# Patient Record
Sex: Female | Born: 1945 | Race: White | Hispanic: No | Marital: Married | Smoking: Never smoker
Health system: Southern US, Community
[De-identification: ages and names within clinical notes are randomized; demographics above are authoritative.]

## PROBLEM LIST (undated history)

## (undated) DIAGNOSIS — I4891 Unspecified atrial fibrillation: Secondary | ICD-10-CM

## (undated) DIAGNOSIS — I340 Nonrheumatic mitral (valve) insufficiency: Secondary | ICD-10-CM

## (undated) DIAGNOSIS — Z8701 Personal history of pneumonia (recurrent): Secondary | ICD-10-CM

## (undated) DIAGNOSIS — E039 Hypothyroidism, unspecified: Secondary | ICD-10-CM

## (undated) HISTORY — DX: Nonrheumatic mitral (valve) insufficiency: I34.0

## (undated) HISTORY — DX: Hypothyroidism, unspecified: E03.9

## (undated) HISTORY — DX: Personal history of pneumonia (recurrent): Z87.01

## (undated) HISTORY — DX: Unspecified atrial fibrillation: I48.91

## (undated) HISTORY — PX: OTHER SURGICAL HISTORY: SHX169

---

## 2010-05-17 ENCOUNTER — Encounter: Payer: Self-pay | Admitting: Cardiology

## 2010-05-19 ENCOUNTER — Ambulatory Visit: Payer: Self-pay | Admitting: Cardiology

## 2010-05-20 ENCOUNTER — Encounter: Payer: Self-pay | Admitting: Cardiology

## 2010-05-21 ENCOUNTER — Encounter: Payer: Self-pay | Admitting: Cardiology

## 2010-05-22 ENCOUNTER — Encounter: Payer: Self-pay | Admitting: Cardiology

## 2010-05-24 ENCOUNTER — Ambulatory Visit: Payer: Self-pay | Admitting: Cardiology

## 2010-05-24 LAB — CONVERTED CEMR LAB: POC INR: 1.2

## 2010-05-27 ENCOUNTER — Telehealth (INDEPENDENT_AMBULATORY_CARE_PROVIDER_SITE_OTHER): Payer: Self-pay | Admitting: *Deleted

## 2010-05-31 ENCOUNTER — Ambulatory Visit: Payer: Self-pay | Admitting: Cardiology

## 2010-05-31 LAB — CONVERTED CEMR LAB: POC INR: 2.2

## 2010-06-07 ENCOUNTER — Ambulatory Visit: Payer: Self-pay | Admitting: Cardiology

## 2010-06-07 LAB — CONVERTED CEMR LAB: POC INR: 2.8

## 2010-06-17 ENCOUNTER — Ambulatory Visit: Payer: Self-pay | Admitting: Cardiology

## 2010-06-17 DIAGNOSIS — I482 Chronic atrial fibrillation, unspecified: Secondary | ICD-10-CM | POA: Insufficient documentation

## 2010-06-17 DIAGNOSIS — I359 Nonrheumatic aortic valve disorder, unspecified: Secondary | ICD-10-CM | POA: Insufficient documentation

## 2010-06-17 DIAGNOSIS — I080 Rheumatic disorders of both mitral and aortic valves: Secondary | ICD-10-CM | POA: Insufficient documentation

## 2010-06-18 ENCOUNTER — Encounter: Payer: Self-pay | Admitting: Cardiology

## 2010-06-18 LAB — CONVERTED CEMR LAB: Prothrombin Time: 20.4 s

## 2010-06-29 ENCOUNTER — Encounter: Payer: Self-pay | Admitting: Cardiology

## 2010-07-06 ENCOUNTER — Encounter: Payer: Self-pay | Admitting: Cardiology

## 2010-07-09 ENCOUNTER — Ambulatory Visit: Payer: Self-pay | Admitting: Cardiology

## 2010-07-09 ENCOUNTER — Encounter: Payer: Self-pay | Admitting: Cardiology

## 2010-07-23 ENCOUNTER — Ambulatory Visit: Payer: Self-pay | Admitting: Cardiology

## 2010-07-23 LAB — CONVERTED CEMR LAB: POC INR: 1.9

## 2010-08-06 ENCOUNTER — Ambulatory Visit: Payer: Self-pay | Admitting: Cardiology

## 2010-08-13 ENCOUNTER — Ambulatory Visit: Payer: Self-pay | Admitting: Cardiology

## 2010-08-16 ENCOUNTER — Ambulatory Visit: Payer: Self-pay | Admitting: Cardiology

## 2010-08-16 ENCOUNTER — Telehealth (INDEPENDENT_AMBULATORY_CARE_PROVIDER_SITE_OTHER): Payer: Self-pay | Admitting: *Deleted

## 2010-08-16 ENCOUNTER — Encounter (INDEPENDENT_AMBULATORY_CARE_PROVIDER_SITE_OTHER): Payer: Self-pay | Admitting: *Deleted

## 2010-08-16 DIAGNOSIS — I4892 Unspecified atrial flutter: Secondary | ICD-10-CM | POA: Insufficient documentation

## 2010-08-19 ENCOUNTER — Ambulatory Visit: Payer: Self-pay | Admitting: Cardiology

## 2010-08-19 ENCOUNTER — Ambulatory Visit (HOSPITAL_COMMUNITY)
Admission: RE | Admit: 2010-08-19 | Discharge: 2010-08-19 | Payer: Self-pay | Source: Home / Self Care | Admitting: Cardiology

## 2010-08-22 ENCOUNTER — Ambulatory Visit: Payer: Self-pay | Admitting: Cardiology

## 2010-08-22 LAB — CONVERTED CEMR LAB
INR: 1.2
POC INR: 1.2

## 2010-08-23 ENCOUNTER — Encounter: Payer: Self-pay | Admitting: Cardiology

## 2010-08-26 ENCOUNTER — Encounter: Payer: Self-pay | Admitting: Cardiology

## 2010-08-27 ENCOUNTER — Ambulatory Visit: Payer: Self-pay | Admitting: Cardiology

## 2010-08-27 LAB — CONVERTED CEMR LAB: POC INR: 1.9

## 2010-09-02 ENCOUNTER — Ambulatory Visit: Payer: Self-pay | Admitting: Thoracic Surgery (Cardiothoracic Vascular Surgery)

## 2010-09-02 ENCOUNTER — Encounter: Payer: Self-pay | Admitting: Cardiology

## 2010-09-03 ENCOUNTER — Ambulatory Visit (HOSPITAL_COMMUNITY)
Admission: RE | Admit: 2010-09-03 | Discharge: 2010-09-03 | Payer: Self-pay | Admitting: Thoracic Surgery (Cardiothoracic Vascular Surgery)

## 2010-09-05 ENCOUNTER — Encounter: Payer: Self-pay | Admitting: Cardiology

## 2010-09-05 ENCOUNTER — Encounter: Payer: Self-pay | Admitting: Thoracic Surgery (Cardiothoracic Vascular Surgery)

## 2010-09-06 ENCOUNTER — Telehealth (INDEPENDENT_AMBULATORY_CARE_PROVIDER_SITE_OTHER): Payer: Self-pay | Admitting: *Deleted

## 2010-09-09 ENCOUNTER — Ambulatory Visit: Payer: Self-pay | Admitting: Thoracic Surgery (Cardiothoracic Vascular Surgery)

## 2010-09-10 ENCOUNTER — Ambulatory Visit: Payer: Self-pay | Admitting: Thoracic Surgery (Cardiothoracic Vascular Surgery)

## 2010-09-10 ENCOUNTER — Inpatient Hospital Stay (HOSPITAL_COMMUNITY)
Admission: RE | Admit: 2010-09-10 | Discharge: 2010-09-16 | Payer: Self-pay | Source: Home / Self Care | Admitting: Thoracic Surgery (Cardiothoracic Vascular Surgery)

## 2010-09-10 ENCOUNTER — Encounter: Payer: Self-pay | Admitting: Thoracic Surgery (Cardiothoracic Vascular Surgery)

## 2010-09-10 HISTORY — PX: OTHER SURGICAL HISTORY: SHX169

## 2010-09-10 HISTORY — PX: MITRAL VALVE REPAIR: SHX2039

## 2010-09-23 ENCOUNTER — Encounter
Admission: RE | Admit: 2010-09-23 | Discharge: 2010-09-23 | Payer: Self-pay | Admitting: Thoracic Surgery (Cardiothoracic Vascular Surgery)

## 2010-09-23 ENCOUNTER — Encounter: Payer: Self-pay | Admitting: Cardiology

## 2010-09-23 ENCOUNTER — Ambulatory Visit: Payer: Self-pay | Admitting: Thoracic Surgery (Cardiothoracic Vascular Surgery)

## 2010-09-24 ENCOUNTER — Encounter: Payer: Self-pay | Admitting: Cardiology

## 2010-09-24 ENCOUNTER — Telehealth: Payer: Self-pay | Admitting: Cardiology

## 2010-09-24 LAB — CONVERTED CEMR LAB
INR: 1.3
Prothrombin Time: 16.4 s

## 2010-10-01 ENCOUNTER — Ambulatory Visit: Payer: Self-pay | Admitting: Cardiology

## 2010-10-01 LAB — CONVERTED CEMR LAB: POC INR: 3.4

## 2010-10-07 ENCOUNTER — Ambulatory Visit: Payer: Self-pay | Admitting: Cardiology

## 2010-10-07 ENCOUNTER — Encounter: Payer: Self-pay | Admitting: Physician Assistant

## 2010-10-08 ENCOUNTER — Encounter: Payer: Self-pay | Admitting: Cardiology

## 2010-10-15 ENCOUNTER — Ambulatory Visit: Payer: Self-pay | Admitting: Cardiology

## 2010-10-15 LAB — CONVERTED CEMR LAB: POC INR: 2.6

## 2010-10-21 ENCOUNTER — Encounter: Payer: Self-pay | Admitting: Cardiology

## 2010-10-22 ENCOUNTER — Ambulatory Visit: Payer: Self-pay | Admitting: Cardiology

## 2010-10-23 ENCOUNTER — Encounter: Payer: Self-pay | Admitting: Cardiology

## 2010-11-04 ENCOUNTER — Encounter
Admission: RE | Admit: 2010-11-04 | Discharge: 2010-11-04 | Payer: Self-pay | Admitting: Thoracic Surgery (Cardiothoracic Vascular Surgery)

## 2010-11-04 ENCOUNTER — Ambulatory Visit: Payer: Self-pay | Admitting: Thoracic Surgery (Cardiothoracic Vascular Surgery)

## 2010-11-04 ENCOUNTER — Encounter: Payer: Self-pay | Admitting: Cardiology

## 2010-11-08 ENCOUNTER — Ambulatory Visit: Payer: Self-pay | Admitting: Cardiology

## 2010-11-11 ENCOUNTER — Encounter: Payer: Self-pay | Admitting: Cardiology

## 2010-11-15 ENCOUNTER — Telehealth (INDEPENDENT_AMBULATORY_CARE_PROVIDER_SITE_OTHER): Payer: Self-pay | Admitting: *Deleted

## 2010-11-26 ENCOUNTER — Ambulatory Visit: Payer: Self-pay | Admitting: Cardiology

## 2010-12-12 ENCOUNTER — Encounter: Payer: Self-pay | Admitting: Cardiology

## 2010-12-16 ENCOUNTER — Ambulatory Visit
Admission: RE | Admit: 2010-12-16 | Discharge: 2010-12-16 | Payer: Self-pay | Source: Home / Self Care | Attending: Thoracic Surgery (Cardiothoracic Vascular Surgery) | Admitting: Thoracic Surgery (Cardiothoracic Vascular Surgery)

## 2010-12-16 ENCOUNTER — Encounter: Payer: Self-pay | Admitting: Cardiology

## 2010-12-17 ENCOUNTER — Ambulatory Visit: Admission: RE | Admit: 2010-12-17 | Discharge: 2010-12-17 | Payer: Self-pay | Source: Home / Self Care

## 2010-12-24 ENCOUNTER — Telehealth (INDEPENDENT_AMBULATORY_CARE_PROVIDER_SITE_OTHER): Payer: Self-pay | Admitting: *Deleted

## 2010-12-31 NOTE — Medication Information (Signed)
Summary: Coumadin Clinic  Anticoagulant Therapy  Managed by: Vashti Hey, RN PCP: [name] at [facility] Supervising MD: Antoine Poche MD, Fayrene Fearing Indication 1: Atrial Fibrillation Lab Used: [facility] [place] Site: [place] INR POC 1.7 INR RANGE 2.0-3.0  Dietary changes: no    Health status changes: no    Bleeding/hemorrhagic complications: no    Recent/future hospitalizations: no    Any changes in medication regimen? no    Recent/future dental: no  Any missed doses?: no       Is patient compliant with meds? yes       Allergies: 1)  ! Codeine  Anticoagulation Management History:      The patient is taking warfarin and comes in today for a routine follow up visit.  Positive risk factors for bleeding include an age of 67 years or older.  The bleeding index is 'intermediate risk'.  Negative CHADS2 values include Age > 66 years old.  Her last INR was 1.7.  Anticoagulation responsible Lexii Walsh: Antoine Poche MD, Fayrene Fearing.  INR POC: 1.7.  Cuvette Lot#: [id number].  Exp: 07/2011.    Anticoagulation Management Assessment/Plan:      The patient's current anticoagulation dose is Warfarin sodium 5 mg tabs: Use as directed by Anticoagulation Clinic.  The target INR is 2.0-3.0.  The next INR is due 07/23/2010.  Anticoagulation instructions were given to patient.  Results were reviewed/authorized by Vashti Hey, RN.  She was notified by Vashti Hey RN.         Prior Anticoagulation Instructions: Received fax from [facility] with results of PT/INR obtained on pt 06/17/10.  PT 20.4  INR 1.7   Called pt.  Told to increase coumadin tp 7.5mg  once daily except 10mg  on Tuesdays and Fridays  Current Anticoagulation Instructions: INR 1.7 Increase coumadin to 10mg  once daily except 7.5mg  on Mondays, Wednesdays and Fridays

## 2010-12-31 NOTE — Medication Information (Signed)
Summary: Coumadin Clinic  Anticoagulant Therapy  Managed by: Hoover Brunette, LPN PCP: [name] at [facility] Supervising MD: [name] MD, [name] Indication 1: Atrial Fibrillation Lab Used: [facility] [place] Site: [place] INR POC 4.3 INR RANGE 2.0-3.0  Dietary changes: no    Health status changes: no    Bleeding/hemorrhagic complications: no    Recent/future hospitalizations: no    Any changes in medication regimen? no    Recent/future dental: no  Any missed doses?: no       Is patient compliant with meds? yes       Allergies: 1)  ! Codeine  Anticoagulation Management History:      The patient is taking warfarin and comes in today for a routine follow up visit.  Warfarin therapy is being given due to Atrial Fibrillation .  Positive risk factors for bleeding include an age of 43 years or older.  Negative risk factors for bleeding include no history of CVA/TIA, no history of GI bleeding, and absence of serious comorbidities.  The bleeding index is 'intermediate risk'.  Positive CHADS2 values include History of HTN.  Negative CHADS2 values include History of CHF, Age > 37 years old, History of Diabetes, and Prior Stroke/CVA/TIA.  The start date was 05/22/2010.  Her last INR was 1.30.  Anticoagulation responsible provider: [name] MD, [name].  INR POC: 4.3.  Cuvette Lot#: [id number].  Exp: 07/2011.    Anticoagulation Management Assessment/Plan:      The patient's current anticoagulation dose is Warfarin sodium 5 mg tabs: Use as directed by Anticoagulation Clinic.  The target INR is 2.0-3.0.  The next INR is due 10/15/2010.  Anticoagulation instructions were given to patient.  Results were reviewed/authorized by Hoover Brunette, LPN.  She was notified by Hoover Brunette, LPN.         Prior Anticoagulation Instructions: INR 3.4 Decrease coumadin to 10mg  once daily except 7.5mg  on T,Th,Sat  Current Anticoagulation Instructions: INR 4.3 Decrease coumadin to 7.5mg  once daily

## 2010-12-31 NOTE — Consult Note (Signed)
Summary: CARDIOLOGY CONSULT/ [facility] DR. GOLDEN  CARDIOLOGY CONSULT/ [facility] DR. GOLDEN   Imported By: Zachary George 05/23/2010 09:38:23  _____________________________________________________________________  External Attachment:    Type:   Image     Comment:   External Document

## 2010-12-31 NOTE — Miscellaneous (Signed)
Summary: Orders Update - TTS  Clinical Lists Changes  Orders: Added new Referral order of TCTS Referral (TCTS Ref) - Signed

## 2010-12-31 NOTE — Medication Information (Signed)
Summary: Coumadin Clinic  Anticoagulant Therapy  Managed by: Vashti Hey, RN PCP: [name] at [facility] Supervising MD: [name] MD, [name] Indication 1: Atrial Fibrillation Lab Used: [facility] [place] Site: [place] PT 20.4 INR RANGE 2.0-3.0  Dietary changes: no    Health status changes: no    Bleeding/hemorrhagic complications: no    Recent/future hospitalizations: no    Any changes in medication regimen? no    Recent/future dental: no  Any missed doses?: no       Is patient compliant with meds? yes       Allergies: 1)  ! Codeine  Anticoagulation Management History:      The patient is taking warfarin and comes in today for a routine follow up visit.  Positive risk factors for bleeding include an age of 67 years or older.  The bleeding index is 'intermediate risk'.  Negative CHADS2 values include Age > 42 years old.  Today's INR is 1.7.  Prothrombin time is 20.4.  Anticoagulation responsible provider: [name] MD, [name].  Cuvette Lot#: [id number].  Exp: 07/2011.    Anticoagulation Management Assessment/Plan:      The patient's current anticoagulation dose is Warfarin sodium 5 mg tabs: Use as directed by Anticoagulation Clinic.  The target INR is 2.0-3.0.  The next INR is due 07/09/2010.  Anticoagulation instructions were given to patient.  Results were reviewed/authorized by Vashti Hey, RN.  She was notified by Vashti Hey RN.         Prior Anticoagulation Instructions: INR 2.8 Decrease coumadin to 7.5mg  once daily  Recheck INR 7/18 at Dr [name] appt  Current Anticoagulation Instructions: Received fax from [facility] with results of PT/INR obtained on pt 06/17/10.  PT 20.4  INR 1.7   Called pt.  Told to increase coumadin tp 7.5mg  once daily except 10mg  on Tuesdays and Fridays

## 2010-12-31 NOTE — Medication Information (Signed)
Summary: new CCR/ref:[facility]  Anticoagulant Therapy  Managed by: Weston Brass, PharmD Supervising MD: [name] MD, [name] Indication 1: Atrial Fibrillation Lab Used: [facility] [place] Site: [place] INR POC 1.2 INR RANGE 2.0-3.0  Dietary changes: no    Health status changes: no    Bleeding/hemorrhagic complications: no    Recent/future hospitalizations: yes       Details: discharged 6/22 from [facility].  Diagnosed with Afib.  Discharged on 7.5mg  daily of Coumadin.   Any changes in medication regimen? yes       Details: started on metoprolol 50mg  BID and cardizem 120mg  daily during hospitalizatin  Recent/future dental: no  Any missed doses?: no       Is patient compliant with meds? yes      Comments: Pt educated on bleeding risks, dietary concerns, and medication interactions.  Pt expressed understanding.   Current Medications (verified): 1)  Metoprolol Tartrate 50 Mg Tabs (Metoprolol Tartrate) .... Take One Tablet By Mouth Twice A Day 2)  Diltiazem Hcl Er Beads 120 Mg Xr24h-Cap (Diltiazem Hcl Er Beads) .... Take One Capsule By Mouth Daily 3)  Warfarin Sodium 5 Mg Tabs (Warfarin Sodium) .... Use As Directed By Anticoagulation Clinic 4)  Levothyroxine Sodium 125 Mcg Tabs (Levothyroxine Sodium) .... Take 1 Tablet By Mouth Daily 5)  Calcium Carbonate-Vitamin D 600-400 Mg-Unit  Tabs (Calcium Carbonate-Vitamin D) .... Take 1 Tablet Two Times A Day  Anticoagulation Management History:      The patient comes in today for her initial visit for anticoagulation therapy.  Positive risk factors for bleeding include an age of 67 years or older.  The bleeding index is 'intermediate risk'.  Negative CHADS2 values include Age > 67 years old.  Anticoagulation responsible provider: [name] MD, [name].  INR POC: 1.2.  Cuvette Lot#: [id number].  Exp: 07/2011.    Anticoagulation Management Assessment/Plan:      The patient's current anticoagulation dose is Warfarin sodium 5 mg tabs: Use as  directed by Anticoagulation Clinic.  The next INR is due 05/31/2010.  Anticoagulation instructions were given to patient.  Results were reviewed/authorized by Weston Brass, PharmD.  She was notified by Weston Brass PharmD.         Current Anticoagulation Instructions: INR 1.2  Increase dose to 1 1/2 tablets every day except 2 tablets on Tuesday and Friday.  Recheck INR in 1 week.

## 2010-12-31 NOTE — Miscellaneous (Signed)
Summary: med list update  Clinical Lists Changes

## 2010-12-31 NOTE — Medication Information (Signed)
Summary: ccrv, last check 11/7 --agh  Anticoagulant Therapy  Managed by: Vashti Hey, RN PCP: [name] at [facility] Supervising MD: Diona Browner MD, Remi Deter Indication 1: Atrial Fibrillation Lab Used: [facility] [place] Site: [place] INR POC 2.6 INR RANGE 2.0-3.0  Dietary changes: no    Health status changes: no    Bleeding/hemorrhagic complications: no    Recent/future hospitalizations: no    Any changes in medication regimen? no    Recent/future dental: no  Any missed doses?: no       Is patient compliant with meds? yes       Allergies: 1)  ! Codeine  Anticoagulation Management History:      Warfarin therapy is being given due to Atrial Fibrillation .  Positive risk factors for bleeding include an age of 25 years or older.  Negative risk factors for bleeding include no history of CVA/TIA, no history of GI bleeding, and absence of serious comorbidities.  The bleeding index is 'intermediate risk'.  Positive CHADS2 values include History of HTN.  Negative CHADS2 values include History of CHF, Age > 20 years old, History of Diabetes, and Prior Stroke/CVA/TIA.  The start date was 05/22/2010.  Her last INR was 1.30.  Anticoagulation responsible provider: Diona Browner MD, Remi Deter.  INR POC: 2.6.  Exp: 07/2011.    Anticoagulation Management Assessment/Plan:      The patient's current anticoagulation dose is Warfarin sodium 5 mg tabs: Use as directed by Anticoagulation Clinic.  The target INR is 2.0-3.0.  The next INR is due 10/22/2010.  Anticoagulation instructions were given to patient.  Results were reviewed/authorized by Vashti Hey, RN.  She was notified by Vashti Hey RN.         Prior Anticoagulation Instructions: INR 4.3 Decrease coumadin to 7.5mg  once daily   Current Anticoagulation Instructions: INR 2.6   Continue coumadin 7.5mg  once daily

## 2010-12-31 NOTE — Progress Notes (Signed)
Summary: scheduled for MV repair  Phone Note Call from Patient   Reason for Call: Talk to Nurse Summary of Call: Pt called stating she will be having Mitral Valve Repair by Dr Cornelius Moras on 09/10/10.  She went off her coumadin on 09/03/10.  She will try to get home health after surgery [facility]) if not she will come here for INR checks. Initial call taken by: Vashti Hey RN,  September 06, 2010 9:35 AM

## 2010-12-31 NOTE — Assessment & Plan Note (Signed)
Summary: hosp consult/ fu/ dc [facility] 6-22/needs INR-lr   Visit Type:  hospital follow-up Primary Provider:  [name] at [facility]   History of Present Illness: Tracy Acosta is a 65 year old female with a history of long-standing afebrile weight in our hospital records. Tracy Acosta was recently admitted with hypercarbic hypoxemia and shortness of breath. She was found to have pneumonia with patchy bilateral lower lobe air space infiltrates. She also had CT scan done which showed no pulmonary embolism. She had bilateral pleural effusions with basilar consultation and patchy parenchymal changes in Tracy upper lung zones. She was also found to have a fibrillation with rapid heart rate. In addition to metoprolol she was also started on diltiazem.in [place] records it has reported that this Acosta has chronic atrial fibrillation however early in [place] office Tracy Acosta is in normal sinus rhythm. Tracy Acosta is currently asymptomatic. She denies any chest pain shortness of breath orthopnea PND. She had a followup x-ray done which showed no residual infiltrates.  An echocardiogram performed demonstrated an ejection fraction of 65% but with moderate mitral regurgitation and mild aortic stenosis. Tracy Acosta did have moderate pulmonary hypertension with PA systolic pressure of 48 mmHg. She currently denies palpitations or syncope  Preventive Screening-Counseling & Management  Alcohol-Tobacco     Smoking Status: never  Current Medications (verified): 1)  Metoprolol Tartrate 50 Mg Tabs (Metoprolol Tartrate) .... Take One Tablet By Mouth Twice A Day 2)  Diltiazem Hcl Er Beads 120 Mg Xr24h-Cap (Diltiazem Hcl Er Beads) .... Take One Capsule By Mouth Daily 3)  Warfarin Sodium 5 Mg Tabs (Warfarin Sodium) .... Use As Directed By Anticoagulation Clinic 4)  Levothyroxine Sodium 125 Mcg Tabs (Levothyroxine Sodium) .... Take 1 Tablet By Mouth Daily  Allergies (verified): 1)  !  Codeine  Comments:  Nurse/Medical Assistant: Tracy Acosta's medication list and allergies were reviewed with Tracy Acosta and were updated in [place] Medication and Allergy Lists.  Past History:  Family History: Last updated: 06/17/2010 multiple family members with a defibrillation and her brother had a pulmonary embolism and an embolic stroke.  Social History: Last updated: 06/17/2010 Tracy Acosta is married and lives locally with her husband. She denies any alcohol tobacco or drug use.  Past Medical History: paroxysmal atrial fibrillation Hypothyroidism Pneumonia He showed bilateral pleural effusions Mitral regurgitation and aortic stenosis  Family History: multiple family members with a defibrillation and her brother had a pulmonary embolism and an embolic stroke.  Social History: Tracy Acosta is married and lives locally with her husband. She denies any alcohol tobacco or drug use.Smoking Status:  never  Review of Systems  Tracy Acosta denies fatigue, malaise, fever, weight gain/loss, vision loss, decreased hearing, hoarseness, chest pain, palpitations, shortness of breath, prolonged cough, wheezing, sleep apnea, coughing up blood, abdominal pain, blood in stool, nausea, vomiting, diarrhea, heartburn, incontinence, blood in urine, muscle weakness, joint pain, leg swelling, rash, skin lesions, headache, fainting, dizziness, depression, anxiety, enlarged lymph nodes, easy bruising or bleeding, and environmental allergies.    Vital Signs:  Acosta profile:   65 year old female Height:      68 inches Weight:      198 pounds BMI:     30.21 Pulse rate:   55 / minute BP sitting:   143 / 84  (left arm) Cuff size:   regular  Vitals Entered By: Carlye Grippe (June 17, 2010 9:11 AM)  Nutrition Counseling: Acosta's BMI is greater than 25 and therefore counseled on weight management options.  Physical Exam  Additional Exam:  General: Well-developed, well-nourished in no  distress head: Normocephalic and atraumatic eyes PERRLA/EOMI intact, conjunctiva and lids normal nose: No deformity or lesions mouth normal dentition, normal posterior pharynx neck: Supple, no JVD.  No masses, thyromegaly or abnormal cervical nodes lungs: Normal breath sounds bilaterally without wheezing.  Normal percussion heart: regular rate and rhythm with normal S1 and S2, no S3 or S4.  PMI is normal. 3/ 6 decrescendo murmur at Tracy right upper sternal border.2/6 holosystolic murmur at Tracy apex. abdomen: Normal bowel sounds, abdomen is soft and nontender without masses, organomegaly or hernias noted.  No hepatosplenomegaly musculoskeletal: Back normal, normal gait muscle strength and tone normal pulsus: Pulse is normal in all 4 extremities Extremities: No peripheral pitting edema neurologic: Alert and oriented x 3 skin: Intact without lesions or rashes cervical nodes: No significant adenopathy psychologic: Normal affect    EKG  Procedure date:  06/17/2010  Findings:      normal sinus rhythm. Heart rate 60 beats per minute no acute ischemic changes.  Impression & Recommendations:  Problem # 1:  ATRIAL FIBRILLATION (ICD-427.31) Tracy Acosta is had paroxysmal atrial fibrillation not permanent atrial fibrillation she currently normal sinus rhythm. We'll continue her medical therapy as well as anticoagulation. Her updated medication list for this problem includes:    Metoprolol Tartrate 50 Mg Tabs (Metoprolol tartrate) .Marland Kitchen... Take one tablet by mouth twice a day    Warfarin Sodium 5 Mg Tabs (Warfarin sodium) ..... Use as directed by anticoagulation clinic  Orders: EKG w/ Interpretation (93000) T-Protime, Auto ([id number])  Problem # 2:  MITRAL REGURGITATION (ICD-396.3) Tracy Acosta has mild mitral regurgitation and this will need to be followed with an echocardiogram in Tracy next months. She is currently NYHA class I.  Problem # 3:  AORTIC STENOSIS (ICD-424.1) Tracy Acosta has a  very loud aortic murmur approximately 3-4/6 yet she only appears to have mild aortic stenosis by echocardiogram. Followup in 6 months as outlined above. Her updated medication list for this problem includes:    Metoprolol Tartrate 50 Mg Tabs (Metoprolol tartrate) .Marland Kitchen... Take one tablet by mouth twice a day  Problem # 4:  COUMADIN THERAPY (ICD-V58.61) PT/INR will be checked today.  Acosta Instructions: 1)  Echo in 6 months 2)  Lab:  PT/INR  3)  Follow up in  6 months  Prescriptions: DILTIAZEM HCL ER BEADS 120 MG XR24H-CAP (DILTIAZEM HCL ER BEADS) Take one capsule by mouth daily  #90 x 3   Entered by:   Hoover Brunette, LPN   Authorized by:   Lewayne Bunting, MD, [facility]   Signed by:   Hoover Brunette, LPN on 09/81/1914   Method used:   Faxed to ...       [facility] education [facility])       [address] [address]       [place], [place]  [address]       Ph: [phone]       Fax: [phone]   RxID:   [id number] METOPROLOL TARTRATE 50 MG TABS (METOPROLOL TARTRATE) Take one tablet by mouth twice a day  #180 x 3   Entered by:   Hoover Brunette, LPN   Authorized by:   Lewayne Bunting, MD, [facility]   Signed by:   Hoover Brunette, LPN on 40/34/7425   Method used:   Faxed to ...       [facility] [employer] [facility])       [address] [address]       [place], [place]  [address]       Ph: [phone]       Fax: [phone]   RxID:   [id number]

## 2010-12-31 NOTE — Letter (Signed)
Summary: External Correspondence/ FAXED [facility]  External Correspondence/ FAXED [facility]   Imported By: Dorise Hiss 09/17/2010 15:36:45  _____________________________________________________________________  External Attachment:    Type:   Image     Comment:   External Document

## 2010-12-31 NOTE — Medication Information (Signed)
Summary: ccr-lr  Anticoagulant Therapy  Managed by: Vashti Hey, RN PCP: [name] at [facility] Supervising MD: Diona Browner MD, Remi Deter Indication 1: Atrial Fibrillation Lab Used: [facility] [place] Site: [place] INR POC 3.4 INR RANGE 2.0-3.0  Dietary changes: no    Health status changes: no    Bleeding/hemorrhagic complications: no    Recent/future hospitalizations: yes       Details: S/P Mitral Valve        Allergies: 1)  ! Codeine  Anticoagulation Management History:      The patient is taking warfarin and comes in today for a routine follow up visit.  She is being anticoagulated because of Atrial Fibrillation .  Positive risk factors for bleeding include an age of 50 years or older.  Negative risk factors for bleeding include no history of CVA/TIA, no history of GI bleeding, and absence of serious comorbidities.  The bleeding index is 'intermediate risk'.  Positive CHADS2 values include History of HTN.  Negative CHADS2 values include History of CHF, Age > 56 years old, History of Diabetes, and Prior Stroke/CVA/TIA.  The start date was 05/22/2010.  Her last INR was 1.30.  Anticoagulation responsible provider: Diona Browner MD, Remi Deter.  INR POC: 3.4.  Cuvette Lot#: [id number].  Exp: 07/2011.    Anticoagulation Management Assessment/Plan:      The patient's current anticoagulation dose is Warfarin sodium 5 mg tabs: Use as directed by Anticoagulation Clinic.  The target INR is 2.0-3.0.  The next INR is due 10/07/2010.  Anticoagulation instructions were given to patient.  Results were reviewed/authorized by Vashti Hey, RN.  She was notified by Vashti Hey RN.         Prior Anticoagulation Instructions: INR 1.30 Received fax from Dr Orvan July office with results of PT/INR obtained on pt Monday 09/23/10.  PT 16.4  INR 1.30  Spoke wit pt.  She was discharged on coumadin 5mg  once daily on 09/13/10.  Pt told to increase coumadin to 10mg  once daily (which was her previous  theraputic dose) and recheck INR in office on 10/01/10.  She verbalized understanding.  Current Anticoagulation Instructions: INR 3.4 Decrease coumadin to 10mg  once daily except 7.5mg  on T,Th,Sat

## 2010-12-31 NOTE — Consult Note (Signed)
Summary: CARDIOLOGY CONSULT/ [facility]  CARDIOLOGY CONSULT/ [facility]   Imported By: Zachary George 08/16/2010 11:15:03  _____________________________________________________________________  External Attachment:    Type:   Image     Comment:   External Document  Appended Document: CARDIOLOGY CONSULT/ [facility] patient was recently seen in the emergency room with recurrent atrial flutter/atrial fibrillation. However she was found to have a very pathological mitral regurgitant murmur and also had been complaining of symptoms of shortness of breath since her hospitalization on June of 2011. A diagnosis of community-acquired pneumonia was made, but her BNP level was markedly elevated at that time. I reviewed her chest x-ray also from that admission and it appeared to me that she had congestive heart failure. Her symptoms during this admission also were consistent with heart failure and the patient improved significantly with the addition of Lasix. She was kept overnight for diuresis. I found that she had a very pathological mitral regurgitant murmur and was concerned that she had severe mitral regurgitation. 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 Pulmonary veins were visualized and although there was no definite bony vein flow reversal the jet swirled around the whole left atrium. LV function was normal as a matter of fact was hyperdynamic. The patient however has symptomatic, severe mitral regurgitation and will need mitral valve repair in addition to a Cox-Maze procedure. Severity of mitral regurgitation can be further assessed during cardiac catheterization. If there is any question regarding the severity of mitral regurgitation based on the ventriculogram, certainly an MRI can be considered but is likely not need it as the patient is quite symptomatic and  I see no other obvious causes for her symptoms. The patient is still deciding where she wants to have the valve surgery done. One of the patient's daughters is very insistent that she goes" to the best surgeon". I have given the patient and her family several references, including Dr. Villa Herb at [facility] in [place]. If her insurance company will cover this she would prefer to go there.

## 2010-12-31 NOTE — Progress Notes (Signed)
Summary: CAN NOT DO BLOOD WORK UNTIL AFTER 12  Phone Note Call from Patient Call back at [facility] Phone [phone]   Caller: Patient Reason for Call: Talk to Nurse Details for Reason: BLOOD WORK Summary of Call: RETURNING YOUR CALL IN REFERENCE TO HAVING BLOOD WORK DONE. SAID SHE CAN NOT DO UNTIL AFTER 12.  PLEASE CALL HER BACK ASAP  SHE THOUGHT IT WAS CCR TO BE CHECKED BUT WASNT SURE????? I COULD NOT FIND A NOTE WHERE YOU CALLED. Initial call taken by: Claudette Laws,  August 16, 2010 8:27 AM  Follow-up for Phone Call        Spoke with pt.  She will come at 10:00 for coumadin check.    Follow-up by: Hoover Brunette, LPN,  August 16, 2010 9:48 AM

## 2010-12-31 NOTE — Miscellaneous (Signed)
Summary: Orders Update - cath  Clinical Lists Changes  Problems: Added new problem of ATRIAL FLUTTER (ICD-427.32) Orders: Added new Referral order of Cardiac Catheterization (Cardiac Cath) - Signed

## 2010-12-31 NOTE — Consult Note (Signed)
Summary: [facility] New Patient Consult  [facility] New Patient Consult   Imported By: Roderic Ovens 09/17/2010 11:35:00  _____________________________________________________________________  External Attachment:    Type:   Image     Comment:   External Document

## 2010-12-31 NOTE — Medication Information (Signed)
Summary: ccr-lr  Anticoagulant Therapy  Managed by: Vashti Hey, RN PCP: [name] at [facility] Supervising MD: Myrtis Ser MD, Tinnie Gens Indication 1: Atrial Fibrillation Lab Used: [facility] [place] Site: [place] INR POC 1.8 INR RANGE 2.0-3.0  Dietary changes: no    Health status changes: no    Bleeding/hemorrhagic complications: no    Recent/future hospitalizations: no    Any changes in medication regimen? no    Recent/future dental: no  Any missed doses?: no       Is patient compliant with meds? yes       Allergies: 1)  ! Codeine  Anticoagulation Management History:      The patient is taking warfarin and comes in today for a routine follow up visit.  Warfarin therapy is being given due to Atrial Fibrillation .  Positive risk factors for bleeding include an age of 67 years or older.  Negative risk factors for bleeding include no history of CVA/TIA, no history of GI bleeding, and absence of serious comorbidities.  The bleeding index is 'intermediate risk'.  Positive CHADS2 values include History of HTN.  Negative CHADS2 values include History of CHF, Age > 67 years old, History of Diabetes, and Prior Stroke/CVA/TIA.  The start date was 05/22/2010.  Her last INR was 1.30.  Anticoagulation responsible provider: Myrtis Ser MD, Tinnie Gens.  INR POC: 1.8.  Cuvette Lot#: [id number].  Exp: 07/2011.    Anticoagulation Management Assessment/Plan:      The patient's current anticoagulation dose is Warfarin sodium 5 mg tabs: Use as directed by Anticoagulation Clinic.  The target INR is 2.0-3.0.  The next INR is due 11/26/2010.  Anticoagulation instructions were given to patient.  Results were reviewed/authorized by Vashti Hey, RN.  She was notified by Vashti Hey RN.         Prior Anticoagulation Instructions: INR 2.3 Continue coumadin 7.5mg  once daily   Current Anticoagulation Instructions: INR 1.8 Increase coumadin to 7.5mg  once daily except 10mg  on Mondays and Fridays

## 2010-12-31 NOTE — Medication Information (Signed)
Summary: ccrv, last check 9/22   --agh  Anticoagulant Therapy  Managed by: Vashti Hey, RN PCP: [name] at [facility] Supervising MD: [name] MD, [name] Indication 1: Atrial Fibrillation Lab Used: [facility] [place] Site: [place] INR POC 1.9 INR RANGE 2.0-3.0  Dietary changes: no    Health status changes: no    Bleeding/hemorrhagic complications: no    Recent/future hospitalizations: yes       Details: pending MVR Dr Cornelius Moras  Any changes in medication regimen? no    Recent/future dental: no  Any missed doses?: no       Is patient compliant with meds? yes       Allergies: 1)  ! Codeine  Anticoagulation Management History:      The patient is taking warfarin and comes in today for a routine follow up visit.  She is being anticoagulated because of Atrial Fibrillation .  Positive risk factors for bleeding include an age of 66 years or older.  Negative risk factors for bleeding include no history of CVA/TIA, no history of GI bleeding, and absence of serious comorbidities.  The bleeding index is 'intermediate risk'.  Positive CHADS2 values include History of HTN.  Negative CHADS2 values include History of CHF, Age > 70 years old, History of Diabetes, and Prior Stroke/CVA/TIA.  The start date was 05/22/2010.  Her last INR was 1.2.  Anticoagulation responsible provider: [name] MD, [name].  INR POC: 1.9.  Cuvette Lot#: [id number].  Exp: 07/2011.    Anticoagulation Management Assessment/Plan:      The patient's current anticoagulation dose is Warfarin sodium 5 mg tabs: Use as directed by Anticoagulation Clinic.  The target INR is 2.0-3.0.  The next INR is due 09/06/2010.  Anticoagulation instructions were given to patient.  Results were reviewed/authorized by Vashti Hey, RN.  She was notified by Vashti Hey RN.         Prior Anticoagulation Instructions: INR 1.2 Coumadin has been on hold for cath on 08/19/10.  Restarted that night.  Continue coumadin 10mg  once daily and  recheck INR 08/27/10.  Current Anticoagulation Instructions: INR 1.9 Continue coumadin 10mg  once daily  Has appt with Dr Cornelius Moras 09/02/10 to schedule MVR

## 2010-12-31 NOTE — Assessment & Plan Note (Signed)
Summary: eph-dsch mmh er  Florian Buff requested 2-3 day   Visit Type:  Follow-up Primary Provider:  [name] at [facility]  CC:  palpitations.  History of Present Illness: The patient presents for followup after her an ER visit for shortness of breath. She actually had 2 episodes on consecutive Friday nights starting with a feeling like her head was getting stopped up. She would then get very "panicky". She was restless all my long period she did not describe tachycardia palpitations and in fact was never noted to be in atrial fibrillation either by her sister who is a Engineer, civil (consulting) and with her, on one of the occasions when she presented to urgent care or last week when she came to our emergency room. The etiology of these events was not clear though she was told to followup here. She did not describe chest discomfort, neck or arm discomfort. Prior to this she wasn't having any breathlessness, PND or orthopnea. She said that since the last episode she's been taking over-the-counter "mucous relief" and has been able to avoid any subsequent episodes.  Current Medications (verified): 1)  Metoprolol Tartrate 50 Mg Tabs (Metoprolol Tartrate) .... Take One Tablet By Mouth Twice A Day 2)  Diltiazem Hcl Er Beads 120 Mg Xr24h-Cap (Diltiazem Hcl Er Beads) .... Take One Capsule By Mouth Daily 3)  Warfarin Sodium 5 Mg Tabs (Warfarin Sodium) .... Use As Directed By Anticoagulation Clinic 4)  Levothyroxine Sodium 125 Mcg Tabs (Levothyroxine Sodium) .... Take 1 Tablet By Mouth Daily 5)  Nasonex 50 Mcg/act Susp (Mometasone Furoate) .Marland Kitchen.. 1-2 Sprays/nostril Daily 6)  Zyrtec Hives Relief 10 Mg Tabs (Cetirizine Hcl) .... Take 1 Tablet By Mouth Once A Day 7)  Mucus Relief 400 Mg Tabs (Guaifenesin) .... Take 1 Tablet By Mouth Two Times A Day 8)  Furosemide 20 Mg Tabs (Furosemide) .... Take 1 Tablet By Mouth Two Times A Day  Allergies (verified): 1)  ! Codeine  Comments:  Nurse/Medical Assistant: The patient's  medication list/bottles and allergies were reviewed with the patient and were updated in the Medication and Allergy Lists.  Past History:  Past Medical History: Paroxysmal atrial fibrillation Hypothyroidism Pneumonia Mitral regurgitation and aortic stenosis  Past Surgical History: Arthroscopic Left Knee Surgery  Review of Systems       As stated in the HPI and negative for all other systems.   Vital Signs:  Patient profile:   65 year old female Height:      68 inches Weight:      192 pounds Pulse rate:   60 / minute BP sitting:   132 / 84  (left arm) Cuff size:   regular  Vitals Entered By: Carlye Grippe (July 09, 2010 3:32 PM)  Physical Exam  General:  Well developed, well nourished, in no acute distress. Head:  normocephalic and atraumatic Neck:  Neck supple, no JVD. No masses, thyromegaly or abnormal cervical nodes. Chest Wall:  no deformities or breast masses noted Lungs:  Clear bilaterally to auscultation and percussion. Heart:  S1 and S2 within normal limits, no S3, no S4, no clicks, no rubs, 3/6 apical systolic murmur radiating to the axilla and anteriorly, holosystolic, no diastolic murmurs Abdomen:  Bowel sounds positive; abdomen soft and non-tender without masses, organomegaly, or hernias noted. No hepatosplenomegaly. Msk:  Back normal, normal gait. Muscle strength and tone normal. Pulses:  pulses normal in all 4 extremities Extremities:  No clubbing or cyanosis. Neurologic:  Alert and oriented x 3. Skin:  Intact without  lesions or rashes. Psych:  slightly anxious appearing   Other Orders: EKG w/ Interpretation (93000)  Patient Instructions: 1)  Keep follow up as planned for Echo and appt with Dr. Earnestine Leys in Dec 2011 or Jan 2012. 2)  Xanax 0.25mg  by mouth two times a day as needed.  Prescriptions: XANAX 0.25 MG TABS (ALPRAZOLAM) Take 1 tablet by mouth two times a day as needed  #15 x 0   Entered by:   Cyril Loosen, RN, BSN   Authorized by:   Rollene Rotunda, MD, [facility]   Signed by:   Rollene Rotunda, MD, [facility] on 07/09/2010   Method used:   Handwritten   RxID:   [id number] PROPRANOLOL HCL 10 MG TABS (PROPRANOLOL HCL) Take 1 tablet by mouth three times a day  #90 x 6   Entered by:   Cyril Loosen, RN, BSN   Authorized by:   Rollene Rotunda, MD, [facility]   Signed by:   Cyril Loosen, RN, BSN on 07/09/2010   Method used:   Electronically to        [facility]* (retail)       [address]       [place], [place]  [address]       Ph: [phone]       Fax: [phone]   RxID:   [id number]  Propranolol sent by mistake pharmacy will be notified to cancel this prescription. Cyril Loosen, RN, BSN  July 09, 2010 4:23 PM   Appended Document: eph-dsch mmh er  Florian Buff requested 2-3 day A/P 1)  SOB  - I do not suspect tachycardia arrhythmias. These may well have been panic attacks. Toward that end I have taken the liberty of giving her a prescription for 0.25 mg Xanax to be taken as needed. I gave her 15 pills with no refills and asked her to discuss any further refills or need for this medication with her primary physician.         2)  Atrial fibrillation - She remains on Coumadin. No further therapy is indicated.         3)  Mitral  regurgitation - She has scheduled followup echocardiography and a visit with Dr. Earnestine Leys.  She will keep these appts.

## 2010-12-31 NOTE — Medication Information (Signed)
Summary: ccr-lr  Anticoagulant Therapy  Managed by: Vashti Hey, RN PCP: [name] at [facility] Supervising MD: Diona Browner MD, Remi Deter Indication 1: Atrial Fibrillation Lab Used: [facility] [place] Site: [place] INR POC 2.3 INR RANGE 2.0-3.0  Dietary changes: no    Health status changes: no    Bleeding/hemorrhagic complications: no    Recent/future hospitalizations: no    Any changes in medication regimen? no    Recent/future dental: no  Any missed doses?: no       Is patient compliant with meds? yes       Allergies: 1)  ! Codeine  Anticoagulation Management History:      The patient is taking warfarin and comes in today for a routine follow up visit.  Warfarin therapy is being given due to Atrial Fibrillation .  Positive risk factors for bleeding include an age of 67 years or older.  Negative risk factors for bleeding include no history of CVA/TIA, no history of GI bleeding, and absence of serious comorbidities.  The bleeding index is 'intermediate risk'.  Positive CHADS2 values include History of HTN.  Negative CHADS2 values include History of CHF, Age > 57 years old, History of Diabetes, and Prior Stroke/CVA/TIA.  The start date was 05/22/2010.  Her last INR was 1.30.  Anticoagulation responsible provider: Diona Browner MD, Remi Deter.  INR POC: 2.3.  Cuvette Lot#: [id number].  Exp: 07/2011.    Anticoagulation Management Assessment/Plan:      The patient's current anticoagulation dose is Warfarin sodium 5 mg tabs: Use as directed by Anticoagulation Clinic.  The target INR is 2.0-3.0.  The next INR is due 11/08/2010.  Anticoagulation instructions were given to patient.  Results were reviewed/authorized by Vashti Hey, RN.  She was notified by Vashti Hey RN.         Prior Anticoagulation Instructions: INR 2.6   Continue coumadin 7.5mg  once daily   Current Anticoagulation Instructions: INR 2.3 Continue coumadin 7.5mg  once daily

## 2010-12-31 NOTE — Medication Information (Signed)
Summary: ccr-lr  Anticoagulant Therapy  Managed by: Vashti Hey, RN Supervising MD: Diona Browner MD, Remi Deter Indication 1: Atrial Fibrillation Lab Used: [facility] [place] Site: [place] INR POC 2.8 INR RANGE 2.0-3.0  Dietary changes: no    Health status changes: no    Bleeding/hemorrhagic complications: no    Recent/future hospitalizations: no    Any changes in medication regimen? no    Recent/future dental: no  Any missed doses?: no       Is patient compliant with meds? yes       Anticoagulation Management History:      The patient is taking warfarin and comes in today for a routine follow up visit.  Positive risk factors for bleeding include an age of 67 years or older.  The bleeding index is 'intermediate risk'.  Negative CHADS2 values include Age > 67 years old.  Anticoagulation responsible provider: Diona Browner MD, Remi Deter.  INR POC: 2.8.  Cuvette Lot#: [id number].  Exp: 07/2011.    Anticoagulation Management Assessment/Plan:      The patient's current anticoagulation dose is Warfarin sodium 5 mg tabs: Use as directed by Anticoagulation Clinic.  The target INR is 2.0-3.0.  The next INR is due 06/17/2010.  Anticoagulation instructions were given to patient.  Results were reviewed/authorized by Vashti Hey, RN.  She was notified by Vashti Hey RN.         Prior Anticoagulation Instructions: INR 2.2 Continue coumadin 7.5mg  once daily except 10mg  on Tuesdays and Fridays  Current Anticoagulation Instructions: INR 2.8 Decrease coumadin to 7.5mg  once daily  Recheck INR 7/18 at Dr [name] appt

## 2010-12-31 NOTE — Medication Information (Signed)
Summary: Coumadin Clinic  Anticoagulant Therapy  Managed by: Hoover Brunette LPN PCP: [name] at [facility] Supervising MD: [name] MD, [name] Indication 1: Atrial Fibrillation Lab Used: [facility] [place] Site: [place] INR POC 1.2 INR RANGE 2.0-3.0  Dietary changes: no    Health status changes: no    Bleeding/hemorrhagic complications: no    Recent/future hospitalizations: yes       Details: s/p cardiac cath 08/19/10  Any changes in medication regimen? no    Recent/future dental: no  Any missed doses?: yes     Details: coumadin has been on hold for cath  Is patient compliant with meds? yes       Allergies: 1)  ! Codeine  Anticoagulation Management History:      The patient is taking warfarin and comes in today for a routine follow up visit.  She is being anticoagulated because of Atrial Fibrillation .  Positive risk factors for bleeding include an age of 67 years or older.  Negative risk factors for bleeding include no history of CVA/TIA, no history of GI bleeding, and absence of serious comorbidities.  The bleeding index is 'intermediate risk'.  Positive CHADS2 values include History of HTN.  Negative CHADS2 values include History of CHF, Age > 14 years old, History of Diabetes, and Prior Stroke/CVA/TIA.  The start date was 05/22/2010.  Her last INR was 1.7.  Anticoagulation responsible provider: [name] MD, [name].  INR POC: 1.2.  Cuvette Lot#: [id number].  Exp: 07/2011.    Anticoagulation Management Assessment/Plan:      The patient's current anticoagulation dose is Warfarin sodium 5 mg tabs: Use as directed by Anticoagulation Clinic.  The target INR is 2.0-3.0.  The next INR is due 08/27/2010.  Anticoagulation instructions were given to patient.  Results were reviewed/authorized by [facility] LPN.  She was notified by [facility] LPN.         Prior Anticoagulation Instructions: INR 1.3 Coumadin is on hold for a cath on MOnday  Current Anticoagulation  Instructions: INR 1.2 Coumadin has been on hold for cath on 08/19/10.  Restarted that night.  Continue coumadin 10mg  once daily and recheck INR 08/27/10.

## 2010-12-31 NOTE — Medication Information (Signed)
Summary: ccr-lr  Anticoagulant Therapy  Managed by: Vashti Hey, RN PCP: [name] at [facility] Supervising MD: Antoine Poche MD, Fayrene Fearing Indication 1: Atrial Fibrillation Lab Used: [facility] [place] Site: [place] INR POC 2.0 INR RANGE 2.0-3.0  Dietary changes: no    Health status changes: no    Bleeding/hemorrhagic complications: no    Recent/future hospitalizations: no    Any changes in medication regimen? no    Recent/future dental: no  Any missed doses?: no       Is patient compliant with meds? yes       Allergies: 1)  ! Codeine  Anticoagulation Management History:      The patient is taking warfarin and comes in today for a routine follow up visit.  She is being anticoagulated due to Atrial Fibrillation .  Positive risk factors for bleeding include an age of 65 years or older.  Negative risk factors for bleeding include no history of CVA/TIA, no history of GI bleeding, and absence of serious comorbidities.  The bleeding index is 'intermediate risk'.  Positive CHADS2 values include History of HTN.  Negative CHADS2 values include History of CHF, Age > 34 years old, History of Diabetes, and Prior Stroke/CVA/TIA.  The start date was 05/22/2010.  Her last INR was 1.7.  Anticoagulation responsible provider: Antoine Poche MD, Fayrene Fearing.  INR POC: 2.0.  Cuvette Lot#: [id number].  Exp: 07/2011.    Anticoagulation Management Assessment/Plan:      The patient's current anticoagulation dose is Warfarin sodium 5 mg tabs: Use as directed by Anticoagulation Clinic.  The target INR is 2.0-3.0.  The next INR is due 08/20/2010.  Anticoagulation instructions were given to patient.  Results were reviewed/authorized by Vashti Hey, RN.  She was notified by Vashti Hey RN.         Prior Anticoagulation Instructions: INR 1.9 Increase coumadin to 10mg  once daily   Current Anticoagulation Instructions: INR 2.0 Take coumadin 12.5mg  x 2 then resume 10mg  once daily

## 2010-12-31 NOTE — Medication Information (Signed)
Summary: ccrv  --agh  Anticoagulant Therapy  Managed by: Vashti Hey, RN PCP: [name] at [facility] Supervising MD: Antoine Poche MD, Fayrene Fearing Indication 1: Atrial Fibrillation Lab Used: [facility] [place] Site: [place] INR POC 1.3 INR RANGE 2.0-3.0  Dietary changes: no    Health status changes: no    Bleeding/hemorrhagic complications: no    Recent/future hospitalizations: yes       Details: Going for heart cath on 08/19/10  Any changes in medication regimen? yes       Details: Coumadin has been on hold since 08/13/10  Recent/future dental: no  Any missed doses?: no       Is patient compliant with meds? yes       Allergies: 1)  ! Codeine  Anticoagulation Management History:      The patient is taking warfarin and comes in today for a routine follow up visit.  She is being anticoagulated because of Atrial Fibrillation .  Positive risk factors for bleeding include an age of 40 years or older.  Negative risk factors for bleeding include no history of CVA/TIA, no history of GI bleeding, and absence of serious comorbidities.  The bleeding index is 'intermediate risk'.  Positive CHADS2 values include History of HTN.  Negative CHADS2 values include History of CHF, Age > 32 years old, History of Diabetes, and Prior Stroke/CVA/TIA.  The start date was 05/22/2010.  Her last INR was 1.7.  Anticoagulation responsible Paublo Warshawsky: Antoine Poche MD, Fayrene Fearing.  INR POC: 1.3.  Cuvette Lot#: [id number].  Exp: 07/2011.    Anticoagulation Management Assessment/Plan:      The patient's current anticoagulation dose is Warfarin sodium 5 mg tabs: Use as directed by Anticoagulation Clinic.  The target INR is 2.0-3.0.  The next INR is due 08/22/2010.  Anticoagulation instructions were given to patient.  Results were reviewed/authorized by Vashti Hey, RN.  She was notified by Vashti Hey RN.         Prior Anticoagulation Instructions: INR 2.0 Take coumadin 12.5mg  x 2 then resume 10mg  once daily    Current Anticoagulation Instructions: INR 1.3 Coumadin is on hold for a cath on MOnday

## 2010-12-31 NOTE — Medication Information (Addendum)
Summary: ccr-lr  Anticoagulant Therapy  Managed by: Vashti Hey, RN PCP: [name] at [facility] Supervising MD: [name] MD, [name] Indication 1: Atrial Fibrillation Lab Used: [facility] [place] Site: [place] INR RANGE 2.0-3.0           Allergies: 1)  ! Codeine  Anticoagulation Management History:      Positive risk factors for bleeding include an age of 67 years or older.  The bleeding index is 'intermediate risk'.  Negative CHADS2 values include Age > 67 years old.  Her last INR was 1.7.  Anticoagulation responsible provider: [name] MD, [name].  Exp: 07/2011.    Anticoagulation Management Assessment/Plan:      The patient's current anticoagulation dose is Warfarin sodium 5 mg tabs: Use as directed by Anticoagulation Clinic.  The target INR is 2.0-3.0.  The next INR is due 07/09/2010.  Anticoagulation instructions were given to patient.  Results were reviewed/authorized by Vashti Hey, RN.         Prior Anticoagulation Instructions: Received fax from [facility] with results of PT/INR obtained on pt 06/17/10.  PT 20.4  INR 1.7   Called pt.  Told to increase coumadin tp 7.5mg  once daily except 10mg  on Tuesdays and Fridays

## 2010-12-31 NOTE — Progress Notes (Signed)
Summary: SOB  Phone Note Call from Patient Call back at [facility] Phone [phone]   Summary of Call: Pt called the office c/o SOB and trouble sleeping. She states she does okay during the day but gets in a panic at night. She states she thinks this is related to the pneumonia she had in the hospital. Pt scheduled for CCR visit on 7/1 and appt with Dr. Earnestine Leys on 7/18. Pt notified to see primary MD for SOB that she feels is related to pneumonia. Pt state she doesn't have a primary care physician. She is aware to go to Urgent Care for evaluation of SOB with no primary MD. Initial call taken by: Cyril Loosen, RN, BSN,  May 27, 2010 8:41 AM

## 2010-12-31 NOTE — Assessment & Plan Note (Signed)
Summary: ROV POST MYTRAL VALVE REPLACEMENT   Visit Type:  Follow-up Primary Provider:  [name] at [facility]   History of Present Illness: The patient is is a 6 old female with  severe mitral regurgitation. The patient is status post mini thoracotomy for mitral valve repair with a complex repair including a quadrangle resection of the posterior leaflet and patch augmentation of the posterior leaflet. She also received artificial Gore-Tex chordae  replacement x2 and a 32 mm e in ring annuloplasty. The patient underwent also a prior Maze procedure with a left-sided lesion set. The patient has done well. She remains in normal sinus rhythm. She reports no palpitations or syncope. She does feel weak at times. She has occasional episodes of nausea but overall much improved since her surgery. The patient is on Coumadin and is followed in the Coumadin clinic. Her INR was 4.3 and we made adjustments in her dosing schedule today. Recent laboratory work done also revealed a hemoglobin of 13.4 and a platelet count of 466. the patient states that she has difficulty sleeping. She has to sleep in a recliner or otherwise she gets very panicky. She uses Xanax p.r.n.  Preventive Screening-Counseling & Management  Alcohol-Tobacco     Smoking Status: never  Current Medications (verified): 1)  Metoprolol Tartrate 50 Mg Tabs (Metoprolol Tartrate) .... Take One Tablet By Mouth Twice A Day 2)  Warfarin Sodium 5 Mg Tabs (Warfarin Sodium) .... Use As Directed By Anticoagulation Clinic 3)  Levothyroxine Sodium 125 Mcg Tabs (Levothyroxine Sodium) .... Take 1 Tablet By Mouth Daily 4)  Nasonex 50 Mcg/act Susp (Mometasone Furoate) .Marland Kitchen.. 1-2 Sprays/nostril Daily 5)  Zyrtec Hives Relief 10 Mg Tabs (Cetirizine Hcl) .... Take 1 Tablet By Mouth Once A Day 6)  Mucus Relief 400 Mg Tabs (Guaifenesin) .... Take 1 Tablet By Mouth Two Times A Day 7)  Xanax 0.25 Mg Tabs (Alprazolam) .... Take 1 Tablet By Mouth Two Times A  Day As Needed 8)  Aspir-Low 81 Mg Tbec (Aspirin) .... Take 1 Tablet By Mouth Once A Day 9)  Clonazepam 0.5 Mg Tabs (Clonazepam) .... Take 1 Tab By Mouth At Bedtime 10)  Flonase 50 Mcg/act Susp (Fluticasone Propionate) .... One Spray Each Nostril Two Times A Day As Needed 11)  Ondansetron Hcl 4 Mg Tabs (Ondansetron Hcl) .... Take One Tab Up To Two Times A Day For  Nausea & Vomiting As Needed  Allergies (verified): 1)  ! Codeine  Comments:  Nurse/Medical Assistant: The patient's medications and allergies were verbally reviewed with the patient and were updated in the Medication and Allergy Lists.  Past History:  Past Surgical History: Last updated: 07/09/2010 Arthroscopic Left Knee Surgery  Family History: Last updated: 06/17/2010 multiple family members with a defibrillation and her brother had a pulmonary embolism and an embolic stroke.  Social History: Last updated: 06/17/2010 the patient is married and lives locally with her husband. She denies any alcohol tobacco or drug use.  Risk Factors: Smoking Status: never (10/07/2010)  Past Medical History: Paroxysmal atrial fibrillationnormal sinus rhythm as off October 2011 Hypothyroidism Pneumonia Mitral regurgitation and aortic stenosis Status post mitral valve repair, status post right mini thoracotomy, status post quadrangular resection and patch repair of posterior leaflet. Artificial Gore-Tex cords for replacement x2, 32 mm sore and ring annuloplasty, prior Maze procedure left-sided lesion site. October 2011 Coumadin anticoagulation  Review of Systems       The patient complains of fatigue, malaise, and nausea.  The patient denies fever, weight  gain/loss, vision loss, decreased hearing, hoarseness, chest pain, palpitations, shortness of breath, prolonged cough, wheezing, sleep apnea, coughing up blood, abdominal pain, blood in stool, vomiting, diarrhea, heartburn, incontinence, blood in urine, muscle weakness, joint pain,  rash, skin lesions, headache, fainting, dizziness, depression, anxiety, enlarged lymph nodes, easy bruising or bleeding, and environmental allergies.    Vital Signs:  Patient profile:   65 year old female Height:      68 inches Weight:      181 pounds Pulse rate:   69 / minute BP sitting:   117 / 79  (left arm) Cuff size:   large  Vitals Entered By: Carlye Grippe (October 07, 2010 10:54 AM)  Physical Exam  Additional Exam:  General: Well-developed, well-nourished in no distress head: Normocephalic and atraumatic eyes PERRLA/EOMI intact, conjunctiva and lids normal nose: No deformity or lesions mouth normal dentition, normal posterior pharynx neck: Supple, no JVD.  No masses, thyromegaly or abnormal cervical nodes lungs: Normal breath sounds bilaterally without wheezing.  Normal percussion Chest transverse minithoracotomy scar well-healed heart: regular rate and rhythm with normal S1 and S2, no S3 or S4.  PMI is normal. no residual murmurs. abdomen: Normal bowel sounds, abdomen is soft and nontender without masses, organomegaly or hernias noted.  No hepatosplenomegaly musculoskeletal: Back normal, normal gait muscle strength and tone normal pulsus: Pulse is normal in all 4 extremities Extremities: No peripheral pitting edema, examined the patient's left groin well-healed scar no murmur neurologic: Alert and oriented x 3 skin: Intact without lesions or rashes cervical nodes: No significant adenopathy psychologic: Normal affect    EKG  Procedure date:  10/07/2010  Findings:      normal sinus rhythm with occasional PACs  EKG  Procedure date:  10/07/2010  Findings:      significant T-wave inversion but no history of coronary artery disease  Impression & Recommendations:  Problem # 1:  MITRAL REGURGITATION (ICD-396.3) status post complex mitral valve repair. The patient is doing well. She has no significant MR murmur and exam. We will obtain an echocardiogram to her  next clinic visit.  Problem # 2:  ATRIAL FIBRILLATION (ICD-427.31) the patient remains in normal sinus rhythm. She status post a cryo-Maze procedure with the left sided lesion set. She remains on Coumadin. Her updated medication list for this problem includes:    Metoprolol Tartrate 50 Mg Tabs (Metoprolol tartrate) .Marland Kitchen... Take one tablet by mouth twice a day    Warfarin Sodium 5 Mg Tabs (Warfarin sodium) ..... Use as directed by anticoagulation clinic    Aspir-low 81 Mg Tbec (Aspirin) .Marland Kitchen... Take 1 tablet by mouth once a day  Orders: EKG w/ Interpretation (93000) Protime INR (04540)  Problem # 3:  COUMADIN THERAPY (ICD-V58.61) Assessment: Comment Only I adjusted the patient's Coumadin based on her INR 4.3. Coumadin will be dosed at 7.5 mg p.o. q. daily. Followup PT/INR will be next week. Orders: Protime INR (98119)  Patient Instructions: 1)  Coumadin 7.5mg  daily 2)  Clonazepam 0.5mg  at bedtime  3)  Flonase Nasal Spray two times a day  4)  Zofran 4mg  two times a day as needed  5)  Follow up in  3 months Prescriptions: ONDANSETRON HCL 4 MG TABS (ONDANSETRON HCL) take one tab up to two times a day for  nausea & vomiting as needed  #30 x 1   Entered by:   Hoover Brunette, LPN   Authorized by:   Lewayne Bunting, MD, [facility]   Signed by:   Inocencio Homes  Hill, LPN on 16/09/9603   Method used:   Faxed to ...       [facility]  [address] * (retail)       [address]       [place], [place]  [address]       Ph: [phone]       Fax: [phone]   RxID:   [id number] FLONASE 50 MCG/ACT SUSP (FLUTICASONE PROPIONATE) one spray each nostril two times a day as needed  #1 x 1   Entered by:   Hoover Brunette, LPN   Authorized by:   Lewayne Bunting, MD, [facility]   Signed by:   Hoover Brunette, LPN on 25/36/6440   Method used:   Faxed to ...       [facility]  [address] (retail)       [address]       [place], [place]  [address]       Ph: [phone]       Fax: [phone]   RxID:   [id number] CLONAZEPAM 0.5  MG TABS (CLONAZEPAM) Take 1 tab by mouth at bedtime  #30 x 1   Entered by:   Hoover Brunette, LPN   Authorized by:   Lewayne Bunting, MD, [facility]   Signed by:   Hoover Brunette, LPN on 20/25/4270   Method used:   Print then Give to Patient   RxID:   [id number]   Handout requested.   Anticoagulant Therapy  Managed by: Vashti Hey, RN PCP: [name] at [facility] Supervising MD: Diona Browner MD, Remi Deter Indication 1: Atrial Fibrillation Lab Used: [facility] [place] Site: [place] INR POC 4.3 INR RANGE 2.0-3.0  Vital Signs: Weight: 181 lbs.  Pulse Rate: 69  Blood Pressure:  117 / 79   Dietary changes: no    Health status changes: no    Bleeding/hemorrhagic complications: no    Recent/future hospitalizations: no    Any changes in medication regimen? no    Recent/future dental: no  Any missed doses?: no       Is patient compliant with meds? yes

## 2010-12-31 NOTE — Progress Notes (Signed)
Summary: med question  Phone Note Call from Patient Call back at [facility] Phone [phone]   Caller: Patient Reason for Call: Talk to Nurse Details for Reason: med Summary of Call: #3 on instructions----instruct her to take aspirin.  She doesnt take aspirin.  She needs to know how much and what strength she needs to take Initial call taken by: Claudette Laws,  August 16, 2010 3:20 PM  Follow-up for Phone Call        Advised pt that #3 regarding aspirin does not apply to her.   Patient verbalized understanding.  Hoover Brunette, LPN  August 16, 2010 5:08 PM

## 2010-12-31 NOTE — Letter (Signed)
Summary: External Correspondence/ FAXED PRE-CATH ORDER  External Correspondence/ FAXED PRE-CATH ORDER   Imported By: Dorise Hiss 08/20/2010 11:58:32  _____________________________________________________________________  External Attachment:    Type:   Image     Comment:   External Document

## 2010-12-31 NOTE — Letter (Signed)
Summary: Cardiac Cath Instructions - Main Lab  [facility]. [address]   [place], [place] [address]   Phone: [phone]  Fax: [phone]     08/16/2010 MRN: [id number]  [facility] [address] [place], [place]  [address]  Dear Ms. Goins,   You are scheduled for Cardiac Catheterization on Monday, September 19 at 10:30 with Dr. Juanda Chance.  Please arrive at the [facility] at 8:30       a.m. on the day of your procedure.  1. DIET     __X__ Nothing to eat or drink after midnight except your medications with a sip of water.  2. Come to the [place] office on             for lab work.  The lab at [facility] is open from 8:30 a.m. to 1:30 p.m. and 2:30 p.m. to 5:00 p.m.  The lab at [facility] is open from 7:30 a.m. to 5:30 p.m.  You do not have to be fasting.  3. MAKE SURE YOU TAKE YOUR ASPIRIN.  4. __X___ DO NOT TAKE these medications before your procedure:  Lasix, Aldactone     5. __X___ YOU MAY TAKE ALL of your remaining medications with a small amount of water.  6._____ START NEW medications:  7._____ Pre-med instructions:     _______________________________________________________________________  8. Plan for one night stay - bring personal belongings (i.e. toothpaste, toothbrush, etc.)  9. Bring a current list of your medications and current insurance cards.  10.Must have a responsible person to drive you home.   11.Someone must be with you for the first 24 hours after you arrive home.  9. Please wear clothes that are easy to get on and off and wear slip-on shoes.  *Special note: Every effort is made to have your procedure done on time.  Occasionally there are emergencies that present themselves at the hospital that may cause delays.  Please be patient if a delay does occur.  If you have any questions after you get home, please call the office at the number listed  above.  Hoover Brunette, LPN

## 2010-12-31 NOTE — Letter (Signed)
Summary: [facility] D/C DR. Wende Crease  [facility] D/C DR. Wende Crease   Imported By: Zachary George 06/17/2010 09:08:39  _____________________________________________________________________  External Attachment:    Type:   Image     Comment:   External Document

## 2010-12-31 NOTE — Miscellaneous (Signed)
Summary: Orders Update - cardiac rehab  Clinical Lists Changes  Orders: Added new Referral order of Cardiac Rehabilitation (Cardiac Rehab) - Signed

## 2010-12-31 NOTE — Medication Information (Signed)
Summary: rov/sp  Anticoagulant Therapy  Managed by: Vashti Hey, RN Supervising MD: Antoine Poche MD, Fayrene Fearing Indication 1: Atrial Fibrillation Lab Used: [facility] [place] Site: [place] INR POC 2.2 INR RANGE 2.0-3.0  Dietary changes: no    Health status changes: yes       Details: had fluid/congestion  with [facility]  Bleeding/hemorrhagic complications: no    Recent/future hospitalizations: no    Any changes in medication regimen? yes       Details: started on lasix 20mg  qd and Avelox 400mg  x 10 days  oday is 4th day  will finish on 06/06/10  Recent/future dental: no  Any missed doses?: no       Is patient compliant with meds? yes       Anticoagulation Management History:      The patient is taking warfarin and comes in today for a routine follow up visit.  Positive risk factors for bleeding include an age of 67 years or older.  The bleeding index is 'intermediate risk'.  Negative CHADS2 values include Age > 67 years old.  Anticoagulation responsible provider: Antoine Poche MD, Fayrene Fearing.  INR POC: 2.2.  Cuvette Lot#: [id number].  Exp: 07/2011.    Anticoagulation Management Assessment/Plan:      The patient's current anticoagulation dose is Warfarin sodium 5 mg tabs: Use as directed by Anticoagulation Clinic.  The target INR is 2.0-3.0.  The next INR is due 06/07/2010.  Anticoagulation instructions were given to patient.  Results were reviewed/authorized by Vashti Hey, RN.  She was notified by Vashti Hey RN.        Coagulation management information includes: Finishes Avelox on 06/06/10.  Prior Anticoagulation Instructions: INR 1.2  Increase dose to 1 1/2 tablets every day except 2 tablets on Tuesday and Friday.  Recheck INR in 1 week.   Current Anticoagulation Instructions: INR 2.2 Continue coumadin 7.5mg  once daily except 10mg  on Tuesdays and Fridays

## 2010-12-31 NOTE — Progress Notes (Signed)
Summary: coumadin management  Phone Note Outgoing Call   Call placed by: Vashti Hey RN,  September 24, 2010 4:24 PM Call placed to: Patient Summary of Call: Received fax from Dr Orvan July office with results of PT/INR obtained on pt Monday 09/23/10.  PT 16.4  INR 1.30  Spoke wit pt.  She was discharged on coumadin 5mg  once daily on 09/13/10.  Pt told to increase coumadin to 10mg  once daily (which was her previous theraputic dose) and recheck INR in office on 10/01/10.  She verbalized understanding.     Anticoagulant Therapy  Managed by: Vashti Hey, RN PCP: [name] at [facility] Supervising MD: [name] MD, [name] Indication 1: Atrial Fibrillation Lab Used: [facility] [place] Site: [place] PT 16.4 INR RANGE 2.0-3.0  Dietary changes: no    Health status changes: no    Bleeding/hemorrhagic complications: no    Recent/future hospitalizations: yes       Details: D/C [facility] 09/13/10 after mitral valve repair and cox-maze procedure  Any changes in medication regimen? yes       Details: D/C on coumadin 5mg  qd   Recent/future dental: no  Any missed doses?: no       Is patient compliant with meds? yes         Anticoagulation Management History:      Her anticoagulation is being managed by telephone today.  She is being anticoagulated because of Atrial Fibrillation .  Positive risk factors for bleeding include an age of 71 years or older.  Negative risk factors for bleeding include no history of CVA/TIA, no history of GI bleeding, and absence of serious comorbidities.  The bleeding index is 'intermediate risk'.  Positive CHADS2 values include History of HTN.  Negative CHADS2 values include History of CHF, Age > 52 years old, History of Diabetes, and Prior Stroke/CVA/TIA.  The start date was 05/22/2010.  Her last INR was 1.2 and today's INR is 1.30.  Prothrombin time is 16.4.  Anticoagulation responsible provider: [name] MD, [name].  Exp: 07/2011.    Anticoagulation  Management Assessment/Plan:      The patient's current anticoagulation dose is Warfarin sodium 5 mg tabs: Use as directed by Anticoagulation Clinic.  The target INR is 2.0-3.0.  The next INR is due 10/01/2010.  Anticoagulation instructions were given to patient.  Results were reviewed/authorized by Vashti Hey, RN.  She was notified by Vashti Hey RN.         Prior Anticoagulation Instructions: INR 1.9 Continue coumadin 10mg  once daily  Has appt with Dr Cornelius Moras 09/02/10 to schedule MVR  Current Anticoagulation Instructions: INR 1.30 Received fax from Dr Orvan July office with results of PT/INR obtained on pt Monday 09/23/10.  PT 16.4  INR 1.30  Spoke wit pt.  She was discharged on coumadin 5mg  once daily on 09/13/10.  Pt told to increase coumadin to 10mg  once daily (which was her previous theraputic dose) and recheck INR in office on 10/01/10.  She verbalized understanding.

## 2010-12-31 NOTE — Letter (Signed)
Summary: [facility] Office Visit   [facility] Visit   Imported By: Roderic Ovens 10/09/2010 16:20:17  _____________________________________________________________________  External Attachment:    Type:   Image     Comment:   External Document

## 2010-12-31 NOTE — Medication Information (Signed)
Summary: ccr-lr  Anticoagulant Therapy  Managed by: Vashti Hey, RN PCP: [name] at [facility] Supervising MD: [name] MD, [name] Indication 1: Atrial Fibrillation Lab Used: [facility] [place] Site: [place] INR POC 1.9 INR RANGE 2.0-3.0  Dietary changes: no    Health status changes: no    Bleeding/hemorrhagic complications: no    Recent/future hospitalizations: no    Any changes in medication regimen? no    Recent/future dental: no  Any missed doses?: no       Is patient compliant with meds? yes       Allergies: 1)  ! Codeine  Anticoagulation Management History:      The patient is taking warfarin and comes in today for a routine follow up visit.  Positive risk factors for bleeding include an age of 67 years or older.  The bleeding index is 'intermediate risk'.  Negative CHADS2 values include Age > 27 years old.  Her last INR was 1.7.  Anticoagulation responsible provider: [name] MD, [name].  INR POC: 1.9.  Cuvette Lot#: [id number].  Exp: 07/2011.    Anticoagulation Management Assessment/Plan:      The patient's current anticoagulation dose is Warfarin sodium 5 mg tabs: Use as directed by Anticoagulation Clinic.  The target INR is 2.0-3.0.  The next INR is due 08/06/2010.  Anticoagulation instructions were given to patient.  Results were reviewed/authorized by Vashti Hey, RN.  She was notified by Vashti Hey RN.         Prior Anticoagulation Instructions: INR 1.7 Increase coumadin to 10mg  once daily except 7.5mg  on Mondays, Wednesdays and Fridays  Current Anticoagulation Instructions: INR 1.9 Increase coumadin to 10mg  once daily

## 2010-12-31 NOTE — Letter (Signed)
Summary: PPC Nickie Retort FNP  PPC EAST-DIANE BLAIR FNP   Imported By: Zachary George 08/13/2010 10:49:20  _____________________________________________________________________  External Attachment:    Type:   Image     Comment:   External Document

## 2010-12-31 NOTE — Assessment & Plan Note (Signed)
Summary: nurse visit per dictation/needs INR check  Nurse Visit  Comments Patient in office for INR.  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  Discussed with GD and he advised to go ahead and schedule first available for TTS.  Patient verbalized understanding.   Also, does not have a pending OV for you, do you want to see her prior to her seeing Dr. Cornelius Moras?   Allergies: 1)  ! Codeine Laboratory Results   Blood Tests      INR: 1.2   (Normal Range: 0.88-1.12   Therap INR: 2.0-3.5) Comments: Advised to continue Coumadin 5mg  - 2 daily.  Will recheck with Misty Stanley on Tuesday, 9/27.   Hoover Brunette, LPN  August 22, 2010 9:42 AM

## 2010-12-31 NOTE — Miscellaneous (Signed)
Summary: Rehab Report/ FAXED CARDIAC REHAB REFERRAL  Rehab Report/ FAXED CARDIAC REHAB REFERRAL   Imported By: Dorise Hiss 10/23/2010 14:28:16  _____________________________________________________________________  External Attachment:    Type:   Image     Comment:   External Document

## 2011-01-02 ENCOUNTER — Encounter: Payer: Self-pay | Admitting: Cardiology

## 2011-01-02 DIAGNOSIS — I059 Rheumatic mitral valve disease, unspecified: Secondary | ICD-10-CM

## 2011-01-02 NOTE — Letter (Signed)
Summary: [facility] - OFFICE NOTE  [facility] - OFFICE NOTE   Imported By: Claudette Laws 12/24/2010 12:45:27  _____________________________________________________________________  External Attachment:    Type:   Image     Comment:   External Document

## 2011-01-02 NOTE — Medication Information (Signed)
Summary: ccr-lr  Anticoagulant Therapy  Managed by: Vashti Hey, RN PCP: [name] at [facility] Supervising MD: [name] MD, [name] Indication 1: Atrial Fibrillation Lab Used: [facility] [place] Site: [place] INR POC 1.9 INR RANGE 2.0-3.0  Dietary changes: no    Health status changes: no    Bleeding/hemorrhagic complications: no    Recent/future hospitalizations: no    Any changes in medication regimen? no    Recent/future dental: no  Any missed doses?: no       Is patient compliant with meds? yes       Allergies: 1)  ! Codeine  Anticoagulation Management History:      The patient is taking warfarin and comes in today for a routine follow up visit.  Anticoagulation is being administered due to Atrial Fibrillation .  Positive risk factors for bleeding include an age of 67 years or older.  Negative risk factors for bleeding include no history of CVA/TIA, no history of GI bleeding, and absence of serious comorbidities.  The bleeding index is 'intermediate risk'.  Positive CHADS2 values include History of HTN.  Negative CHADS2 values include History of CHF, Age > 45 years old, History of Diabetes, and Prior Stroke/CVA/TIA.  The start date was 05/22/2010.  Her last INR was 1.30.  Anticoagulation responsible provider: [name] MD, [name].  INR POC: 1.9.  Cuvette Lot#: [id number].  Exp: 07/2011.    Anticoagulation Management Assessment/Plan:      The patient's current anticoagulation dose is Warfarin sodium 5 mg tabs: Use as directed by Anticoagulation Clinic.  The target INR is 2.0-3.0.  The next INR is due 01/06/2011.  Anticoagulation instructions were given to patient.  Results were reviewed/authorized by Vashti Hey, RN.  She was notified by Vashti Hey RN.         Prior Anticoagulation Instructions: INR 1.8 Take coumadin 10mg  tonight then increase dose to 10mg  once daily except 7.5mg  on S,T,Th  Current Anticoagulation Instructions: INR 1.9 Increase coumadin to 10mg   once daily except 7.5mg  on Sundays

## 2011-01-02 NOTE — Progress Notes (Signed)
Summary: MED REFILL  Phone Note Call from Patient   Caller: Patient Call For: nurse Reason for Call: Talk to Nurse Summary of Call: Pt wants refill for Tramadol and Clonazepam.  She is doing cardiac rehab and states she is having more soreness.  Tramadol came from Dr Cornelius Moras after surgery.  He does not want to refill.  Wants Dr [name] to handle meds.  Clonazepam was Rx by Dr [name].  Told pt I did not know if he would refill or not.  Please call pt. Initial call taken by: Vashti Hey RN,  November 15, 2010 11:25 AM  Follow-up for Phone Call        Both can be refilled. Tramadol for 14 days only. Clonazepam for 30 days . Both no refills.  Follow-up by: Lewayne Bunting, MD, [facility],  November 19, 2010 8:10 AM  Additional Follow-up for Phone Call Additional follow up Details #1::        [facility]. Patient informed of the above via message machine.  Additional Follow-up by: Carlye Grippe,  November 19, 2010 11:48 AM    New/Updated Medications: TRAMADOL HCL 50 MG TABS (TRAMADOL HCL) take one by mouth twice a day needed pain times 14 days only Prescriptions: TRAMADOL HCL 50 MG TABS (TRAMADOL HCL) take one by mouth twice a day needed pain times 14 days only  #30 x 0   Entered by:   Carlye Grippe   Authorized by:   Lewayne Bunting, MD, [facility]   Signed by:   Carlye Grippe on 11/19/2010   Method used:   Telephoned to ...       [facility]  [address] * (retail)       [address]       [place], [place]  [address]       Ph: [phone]       Fax: [phone]   RxID:   [id number] CLONAZEPAM 0.5 MG TABS (CLONAZEPAM) Take 1 tab by mouth at bedtime  #30 x 0   Entered by:   Carlye Grippe   Authorized by:   Lewayne Bunting, MD, [facility]   Signed by:   Carlye Grippe on 11/19/2010   Method used:   Telephoned to ...       [facility]  [address] * (retail)       [address]       [place], [place]  [address]       Ph: [phone]       Fax: [phone]   RxID:   [id number]

## 2011-01-02 NOTE — Medication Information (Signed)
Summary: ccr-lr  Anticoagulant Therapy  Managed by: Tracy Hey, RN PCP: Tracy Acosta at [facility] Supervising MD: [name] MD, [name] Indication 1: Atrial Fibrillation Lab Used: [facility] [place] Site: [place] INR POC 1.8 INR RANGE 2.0-3.0  Dietary changes: no    Health status changes: no    Bleeding/hemorrhagic complications: no    Recent/future hospitalizations: no    Any changes in medication regimen? no    Recent/future dental: no  Any missed doses?: no       Is patient compliant with meds? yes       Allergies: 1)  ! Codeine  Anticoagulation Management History:      The patient is taking warfarin and comes in today for a routine follow up visit.  Warfarin therapy is being given due to Atrial Fibrillation .  Positive risk factors for bleeding include an age of 69 years or older.  Negative risk factors for bleeding include no history of CVA/TIA, no history of GI bleeding, and absence of serious comorbidities.  The bleeding index is 'intermediate risk'.  Positive CHADS2 values include History of HTN.  Negative CHADS2 values include History of CHF, Age > 60 years old, History of Diabetes, and Prior Stroke/CVA/TIA.  The start date was 05/22/2010.  Her last INR was 1.30.  Anticoagulation responsible provider: [name] MD, [name].  INR POC: 1.8.  Exp: 07/2011.    Anticoagulation Management Assessment/Plan:      The patient's current anticoagulation dose is Warfarin sodium 5 mg tabs: Use as directed by Anticoagulation Clinic.  The target INR is 2.0-3.0.  The next INR is due 12/17/2010.  Anticoagulation instructions were given to patient.  Results were reviewed/authorized by Tracy Hey, RN.  She was notified by Tracy Hey RN.         Prior Anticoagulation Instructions: INR 1.8 Increase coumadin to 7.5mg  once daily except 10mg  on Mondays and Fridays  Current Anticoagulation Instructions: INR 1.8 Take coumadin 10mg  tonight then increase dose to 10mg  once daily except  7.5mg  on S,T,Th

## 2011-01-02 NOTE — Letter (Signed)
Summary: [facility] Office Visit Note   [facility] Office Visit Note   Imported By: Roderic Ovens 11/29/2010 12:11:24  _____________________________________________________________________  External Attachment:    Type:   Image     Comment:   External Document

## 2011-01-02 NOTE — Progress Notes (Signed)
Summary: need echo   Phone Note Other Incoming   Caller: walk-in Summary of Call: Has OV for 2/6 with GD.  Recently seen by Dr. Cornelius Moras - he suggested Echo at time of visit with [place].  (See TCT offic note scanned in, dated 12/16/10.)  Is it okay for her to go ahead and have prior to visit or do you need to see her first?  If okay she would like to have done on a M, Tu, Thu after 11:30 as these are the days that she will already be at hospital for her Cardiac Rehab. Initial call taken by: Hoover Brunette, LPN,  December 24, 2010 12:41 PM  Follow-up for Phone Call        and continue to I thought chart he had an echo done check [place] database first if not then go ahead and order an echocardiogram Follow-up by: Lewayne Bunting, MD, [facility],  December 25, 2010 8:41 AM  Additional Follow-up for Phone Call Additional follow up Details #1::        No recent echo, okay to schedule per GD. Hoover Brunette, LPN  December 26, 2010 9:14 AM

## 2011-01-02 NOTE — Miscellaneous (Signed)
Summary: Rehab Report/ CARDIAC REHAB PROGRESS REPORT  Rehab Report/ CARDIAC REHAB PROGRESS REPORT   Imported By: Dorise Hiss 11/11/2010 16:19:49  _____________________________________________________________________  External Attachment:    Type:   Image     Comment:   External Document

## 2011-01-02 NOTE — Letter (Signed)
Summary: External Correspondence/ BLOOD PRESSURE DR. Barry Dienes  External Correspondence/ BLOOD PRESSURE DR. Barry Dienes   Imported By: Dorise Hiss 12/25/2010 12:28:20  _____________________________________________________________________  External Attachment:    Type:   Image     Comment:   External Document

## 2011-01-02 NOTE — Miscellaneous (Signed)
Summary: Rehab Report/ CARDIAC REHAB PROGRESS REPORT  Rehab Report/ CARDIAC REHAB PROGRESS REPORT   Imported By: Dorise Hiss 12/16/2010 16:45:52  _____________________________________________________________________  External Attachment:    Type:   Image     Comment:   External Document

## 2011-01-06 ENCOUNTER — Encounter: Payer: Self-pay | Admitting: Cardiology

## 2011-01-06 ENCOUNTER — Encounter: Payer: Self-pay | Admitting: Thoracic Surgery (Cardiothoracic Vascular Surgery)

## 2011-01-06 ENCOUNTER — Ambulatory Visit (INDEPENDENT_AMBULATORY_CARE_PROVIDER_SITE_OTHER): Payer: Medicare Other | Admitting: Cardiology

## 2011-01-06 DIAGNOSIS — I059 Rheumatic mitral valve disease, unspecified: Secondary | ICD-10-CM | POA: Insufficient documentation

## 2011-01-06 DIAGNOSIS — I10 Essential (primary) hypertension: Secondary | ICD-10-CM | POA: Insufficient documentation

## 2011-01-06 DIAGNOSIS — I4891 Unspecified atrial fibrillation: Secondary | ICD-10-CM

## 2011-01-06 LAB — CONVERTED CEMR LAB
POC INR: 2.5
POC INR: 2.5

## 2011-01-07 ENCOUNTER — Encounter: Payer: Self-pay | Admitting: Cardiology

## 2011-01-08 NOTE — Letter (Signed)
Summary: External Correspondence/ BLOOD PRESSURE READINGS DR. SETTLE  External Correspondence/ BLOOD PRESSURE READINGS DR. SETTLE   Imported By: Dorise Hiss 01/03/2011 08:52:56  _____________________________________________________________________  External Attachment:    Type:   Image     Comment:   External Document

## 2011-01-13 ENCOUNTER — Encounter (INDEPENDENT_AMBULATORY_CARE_PROVIDER_SITE_OTHER): Payer: Self-pay | Admitting: *Deleted

## 2011-01-16 NOTE — Miscellaneous (Signed)
Summary: Rehab Report/ CARDIAC REHAB PROGRESS REPORT  Rehab Report/ CARDIAC REHAB PROGRESS REPORT   Imported By: Dorise Hiss 01/08/2011 09:40:20  _____________________________________________________________________  External Attachment:    Type:   Image     Comment:   External Document

## 2011-01-16 NOTE — Medication Information (Signed)
Summary: Coumadin Clinic  Anticoagulant Therapy  Managed by: Hoover Brunette LPN PCP: [name] at [facility] Supervising MD: [name] MD, [name] Indication 1: Atrial Fibrillation Lab Used: [facility] [place] Site: [place] INR POC 2.5 INR RANGE 2.0-3.0  Dietary changes: no    Health status changes: no    Bleeding/hemorrhagic complications: no    Recent/future hospitalizations: no    Any changes in medication regimen? no    Recent/future dental: no  Any missed doses?: no       Is patient compliant with meds? yes       Allergies: 1)  ! Codeine  Anticoagulation Management History:      The patient is taking warfarin and comes in today for a routine follow up visit.  Anticoagulation is being administered due to Atrial Fibrillation .  Positive risk factors for bleeding include an age of 55 years or older.  Negative risk factors for bleeding include no history of CVA/TIA, no history of GI bleeding, and absence of serious comorbidities.  The bleeding index is 'intermediate risk'.  Positive CHADS2 values include History of HTN.  Negative CHADS2 values include History of CHF, Age > 3 years old, History of Diabetes, and Prior Stroke/CVA/TIA.  The start date was 05/22/2010.  Her last INR was 1.30.  Anticoagulation responsible provider: [name] MD, [name].  INR POC: 2.5.  Cuvette Lot#: [id number].  Exp: 07/2011.    Anticoagulation Management Assessment/Plan:      The patient's current anticoagulation dose is Warfarin sodium 5 mg tabs: Use as directed by Anticoagulation Clinic.  The target INR is 2.0-3.0.  The next INR is due 02/04/2011.  Anticoagulation instructions were given to patient.  Results were reviewed/authorized by [facility] LPN.  She was notified by [facility] LPN.         Prior Anticoagulation Instructions: INR 1.9 Increase coumadin to 10mg  once daily except 7.5mg  on Sundays  Current Anticoagulation Instructions: INR 2.5 Continue coumadin 10mg  once daily except  7.5mg  on Sundays

## 2011-01-16 NOTE — Assessment & Plan Note (Signed)
Summary: Tracy Acosta ONLY 3 MO FU PER FEB REMINDER/NEEDS INR CK/LR -SRS   Visit Type:  Follow-up Primary Provider:  [name] at [facility]   History of Present Illness: the patient is a 65 year old female status post mini thoracotomy for mitral valve repair with a complex repair including a quadrangular resection of the posterior leaflet and patch augmentation.  She also received artificial Gore-Tex chordae replacement x 2 and the 32-mm annuloplasty ring.  She is status post a left-sided maze procedure.  She remains in normal sinus rhythm.  She denies any substernal chest pain.  She denies any palpitations.  She has no shortness of breath.  She currently remains on Coumadin.  She reports no palpitations or syncope.  She is actively participating in cardiac rehab and also reports no problems there her nausea has entirely resolved.  The patient did bring in her readings from her blood pressure during cardiac rehab and her blood pressure is consistently elevated.she's currently taking only metoprolol.  Preventive Screening-Counseling & Management  Alcohol-Tobacco     Smoking Status: never  Current Medications (verified): 1)  Metoprolol Tartrate 50 Mg Tabs (Metoprolol Tartrate) .... Take One Tablet By Mouth Twice A Day 2)  Warfarin Sodium 5 Mg Tabs (Warfarin Sodium) .... Use As Directed By Anticoagulation Clinic 3)  Levothyroxine Sodium 125 Mcg Tabs (Levothyroxine Sodium) .... Take 1 Tablet By Mouth Daily 4)  Zyrtec Hives Relief 10 Mg Tabs (Cetirizine Hcl) .... Take 1 Tablet By Mouth Once A Day 5)  Mucus Relief 400 Mg Tabs (Guaifenesin) .... Take 1 Tablet By Mouth Two Times A Day 6)  Aspir-Low 81 Mg Tbec (Aspirin) .... Take 1 Tablet By Mouth Once A Day 7)  Clonazepam 0.5 Mg Tabs (Clonazepam) .... Take 1 Tab By Mouth At Bedtime 8)  Flonase 50 Mcg/act Susp (Fluticasone Propionate) .... One Spray Each Nostril Two Times A Day As Needed 9)  Tramadol Hcl 50 Mg Tabs (Tramadol Hcl) .... Take One  By Mouth Twice A Day Needed Pain Times 14 Days Only 10)  Aspir-Low 81 Mg Tbec (Aspirin) .... Take 1 Tablet By Mouth Once A Day 11)  Pepcid Ac 10 Mg Tabs (Famotidine) .... Take 1 Tablet By Mouth Once A Day 12)  Lisinopril 5 Mg Tabs (Lisinopril) .... Take 1 Tablet By Mouth Once A Day  Allergies (verified): 1)  ! Codeine  Comments:  Nurse/Medical Assistant: The patient's medication list and allergies were reviewed with the patient and were updated in the Medication and Allergy Lists.  Past History:  Past Medical History: Last updated: 10/07/2010 Paroxysmal atrial fibrillationnormal sinus rhythm as off October 2011 Hypothyroidism Pneumonia Mitral regurgitation and aortic stenosis Status post mitral valve repair, status post right mini thoracotomy, status post quadrangular resection and patch repair of posterior leaflet. Artificial Gore-Tex cords for replacement x2, 32 mm sore and ring annuloplasty, prior Maze procedure left-sided lesion site. October 2011 Coumadin anticoagulation  Past Surgical History: Last updated: 07/09/2010 Arthroscopic Left Knee Surgery  Family History: Last updated: 06/17/2010 multiple family members with a defibrillation and her brother had a pulmonary embolism and an embolic stroke.  Social History: Last updated: 06/17/2010 the patient is married and lives locally with her husband. She denies any alcohol tobacco or drug use.  Risk Factors: Smoking Status: never (01/06/2011)  Review of Systems  The patient denies fatigue, malaise, fever, weight gain/loss, vision loss, decreased hearing, hoarseness, chest pain, palpitations, shortness of breath, prolonged cough, wheezing, sleep apnea, coughing up blood, abdominal pain, blood in stool, nausea,  vomiting, diarrhea, heartburn, incontinence, blood in urine, muscle weakness, joint pain, leg swelling, rash, skin lesions, headache, fainting, dizziness, depression, anxiety, enlarged lymph nodes, easy bruising or  bleeding, and environmental allergies.    Vital Signs:  Patient profile:   65 year old female Height:      68 inches Weight:      178 pounds Pulse rate:   82 / minute BP sitting:   128 / 83  (left arm) Cuff size:   regular  Vitals Entered By: Carlye Grippe (January 06, 2011 1:21 PM)  Physical Exam  Additional Exam:  General: Well-developed, well-nourished in no distress head: Normocephalic and atraumatic eyes PERRLA/EOMI intact, conjunctiva and lids normal nose: No deformity or lesions mouth normal dentition, normal posterior pharynx neck: Supple, no JVD.  No masses, thyromegaly or abnormal cervical nodes lungs: Normal breath sounds bilaterally without wheezing.  Normal percussion Chest transverse minithoracotomy scar well-healed heart: regular rate and rhythm with normal S1 and S2, no S3 or S4.  PMI is normal. no residual murmurs. abdomen: Normal bowel sounds, abdomen is soft and nontender without masses, organomegaly or hernias noted.  No hepatosplenomegaly musculoskeletal: Back normal, normal gait muscle strength and tone normal pulsus: Pulse is normal in all 4 extremities Extremities: No peripheral pitting edema, examined the patient's left groin well-healed scar no murmur neurologic: Alert and oriented x 3 skin: Intact without lesions or rashes cervical nodes: No significant adenopathy psychologic: Normal affect    Impression & Recommendations:  Problem # 1:  MITRAL VALVE DISORDERS (ICD-424.0) status post mitral valve repair and ring annuloplasty.  The patient had a follow-up echocardiogram which shows a durable repair with no significant regurgitation. Her updated medication list for this problem includes:    Metoprolol Tartrate 50 Mg Tabs (Metoprolol tartrate) .Marland Kitchen... Take one tablet by mouth twice a day    Lisinopril 5 Mg Tabs (Lisinopril) .Marland Kitchen... Take 1 tablet by mouth once a day  Orders: T-TSH ([id number])  Her updated medication list for this problem  includes:    Metoprolol Tartrate 50 Mg Tabs (Metoprolol tartrate) .Marland Kitchen... Take one tablet by mouth twice a day  Problem # 2:  ATRIAL FIBRILLATION (ICD-427.31) pt remains in normal sinus rhythm.  She had a left-sided maze procedure.  We will continue her on Coumadin.  I would anticipate she will need at least 6 months of Coumadin and then will reassess her with a Holter monitor to make sure that she does not have silent atrial fibrillation.  If this is the case and Coumadin will be continued. Her updated medication list for this problem includes:    Metoprolol Tartrate 50 Mg Tabs (Metoprolol tartrate) .Marland Kitchen... Take one tablet by mouth twice a day    Warfarin Sodium 5 Mg Tabs (Warfarin sodium) ..... Use as directed by anticoagulation clinic    Aspir-low 81 Mg Tbec (Aspirin) .Marland Kitchen... Take 1 tablet by mouth once a day  Orders: EKG w/ Interpretation ([address]) Protime INR ([address]) T-TSH ([id number])  Problem # 3:  HYPERTENSION, BENIGN ESSENTIAL (ICD-401.1) the patient has several blood pressure readings from cardiac rehab.  Her blood pressure is significantly elevated and we will start her on lisinopril 5 mg p.o. daily.  I initially suggested chlorthalidone but the patient declines to take a diuretic. Her updated medication list for this problem includes:    Metoprolol Tartrate 50 Mg Tabs (Metoprolol tartrate) .Marland Kitchen... Take one tablet by mouth twice a day    Aspir-low 81 Mg Tbec (Aspirin) .Marland Kitchen... Take 1 tablet by mouth  once a day    Lisinopril 5 Mg Tabs (Lisinopril) .Marland Kitchen... Take 1 tablet by mouth once a day  Orders: T-TSH ([id number])  Patient Instructions: 1)  Labs:  TSH 2)  INR check today - 2.5  3)  Lisinopril 5mg  daily 4)  Follow up in  2 months Prescriptions: LISINOPRIL 5 MG TABS (LISINOPRIL) Take 1 tablet by mouth once a day  #30 x 6   Entered by:   Hoover Brunette, LPN   Authorized by:   Lewayne Bunting, MD, [facility]   Signed by:   Hoover Brunette, LPN on 62/13/0865   Method used:   [facility]        [facility]   [address] [facility] ([facility]ail)       [address]       [place], [place]  [address]       Ph: [phone]       Fax: [phone]   RxID:   [id number] FLONASE 50 MCG/ACT SUSP (FLUTICASONE PROPIONATE) one spray each nostril two times a day as needed  #1 x 1   Entered by:   Hoover Brunette, LPN   Authorized by:   Lewayne Bunting, MD, [facility]   Signed by:   Hoover Brunette, LPN on 33/29/5188   Method used:   [facility]        [facility]  [address] * (retail)       [address]       [place], [place]  [address]       Ph: [phone]       Fax: [phone]   RxID:   [id number]   Anticoagulant Therapy  Managed by: Vashti Hey, RN PCP: [name] at [facility] Supervising MD: [name] MD, [name] Indication 1: Atrial Fibrillation Lab Used: [facility] [place] Site: [place] INR POC 2.5 INR RANGE 2.0-3.0  Vital Signs: Weight: 178 lbs.  Pulse Rate: 82  Blood Pressure:  128 / 83   Dietary changes: no    Health status changes: no    Bleeding/hemorrhagic complications: no    Recent/future hospitalizations: no    Any changes in medication regimen? yes       Details: taking mucinex daily for the last 3 days  Recent/future dental: no  Any missed doses?: no       Is patient compliant with meds? yes        Appended Document: Tracy Acosta ONLY 3 MO FU PER FEB REMINDER/NEEDS INR CK/LR -SRS no ekg was done at this visit.

## 2011-01-16 NOTE — Progress Notes (Signed)
Summary: Office Visit/ TCT OFFICE VISIT  Office Visit/ TCT OFFICE VISIT   Imported By: Dorise Hiss 01/06/2011 08:16:08  _____________________________________________________________________  External Attachment:    Type:   Image     Comment:   External Document

## 2011-01-20 ENCOUNTER — Telehealth (INDEPENDENT_AMBULATORY_CARE_PROVIDER_SITE_OTHER): Payer: Self-pay | Admitting: *Deleted

## 2011-01-22 NOTE — Letter (Signed)
Summary: Engineer, materials at [facility]  [address]   [place], [place] [address]   Phone: [phone]  Fax: [phone]        January 13, 2011 MRN: [id number]   [facility] [address] [place], [place]  [address]   Dear Ms. Seidner,  Your test ordered by Selena Batten has been reviewed by your physician (or physician assistant) and was found to be normal or stable. Your physician (or physician assistant) felt no changes were needed at this time.  ____ Echocardiogram  ____ Cardiac Stress Test  __X__ Lab Work - thyroid normal  ____ Peripheral vascular study of arms, legs or neck  ____ CT scan or X-ray  ____ Lung or Breathing test  ____ Other:   Thank you.   Hoover Brunette, LPN    Duane Boston, M.D., F.A.C.C. Thressa Sheller, M.D., F.A.C.C. Oneal Grout, M.D., F.A.C.C. Cheree Ditto, M.D., F.A.C.C. Daiva Nakayama, M.D., F.A.C.C. Kenney Houseman, M.D., F.A.C.C. Jeanne Ivan, PA-C

## 2011-01-28 NOTE — Progress Notes (Signed)
Summary: request 90day supply rx warfarin,lisinopril  Phone Note Call from Patient Call back at Home Phone [phone]   Caller: patient walk in  Reason for Call: Talk to Nurse Summary of Call: [facility]  warfarin currently on 2 pills asking for 90 day supply  lisnopril need 90 day supply  clonaozcpam 90 day supply Initial call taken by: Claudette Laws,  January 20, 2011 12:04 PM    Prescriptions: LISINOPRIL 5 MG TABS (LISINOPRIL) Take 1 tablet by mouth once a day  #90 x 3   Entered by:   Carlye Grippe   Authorized by:   Lewayne Bunting, MD, [facility]   Signed by:   Carlye Grippe on 01/20/2011   Method used:   Faxed to ...       [facility] Environmental education officer)       [address] [address]       [place], [place]  [address]       Ph: [phone]       Fax: [phone]   RxID:   [id number] WARFARIN SODIUM 5 MG TABS (WARFARIN SODIUM) Use as directed by Anticoagulation Clinic  #180 x 1   Entered by:   Carlye Grippe   Authorized by:   Lewayne Bunting, MD, [facility]   Signed by:   Carlye Grippe on 01/20/2011   Method used:   Faxed to ...       [facility] Environmental education officer)       [address] [address]       [place], [place]  [address]       Ph: [phone]       Fax: [phone]   RxID:   [id number]

## 2011-01-31 ENCOUNTER — Telehealth (INDEPENDENT_AMBULATORY_CARE_PROVIDER_SITE_OTHER): Payer: Self-pay | Admitting: *Deleted

## 2011-02-04 ENCOUNTER — Encounter: Payer: Self-pay | Admitting: Cardiology

## 2011-02-04 ENCOUNTER — Encounter (INDEPENDENT_AMBULATORY_CARE_PROVIDER_SITE_OTHER): Payer: Medicare Other

## 2011-02-04 DIAGNOSIS — I4891 Unspecified atrial fibrillation: Secondary | ICD-10-CM

## 2011-02-04 DIAGNOSIS — Z7901 Long term (current) use of anticoagulants: Secondary | ICD-10-CM

## 2011-02-06 NOTE — Progress Notes (Signed)
Summary: REQUEST REFILL CLONAZEPAM  Phone Note Call from Patient Call back at Home Phone [phone]   Caller: Patient Call For: DOCTOR Summary of Call: patient request refill clonazepam for 90 day supply [facility] [address]. she is completely out and said she made request last week. Initial call taken by: Carlye Grippe,  January 31, 2011 1:29 PM  Follow-up for Phone Call        MD informed and okayed one refill. Follow-up by: Carlye Grippe,  January 31, 2011 2:06 PM    Prescriptions: CLONAZEPAM 0.5 MG TABS (CLONAZEPAM) Take 1 tab by mouth at bedtime  #30 x 0   Entered by:   Carlye Grippe   Authorized by:   Lewayne Bunting, MD, [facility]   Signed by:   Carlye Grippe on 01/31/2011   Method used:   [facility]phoned to ...       [facility]  [address] * (retail)       [address]       [place], [place]  [address]       Ph: [phone]       Fax: [phone]   RxID:   [id number]

## 2011-02-10 ENCOUNTER — Encounter: Payer: Self-pay | Admitting: Thoracic Surgery (Cardiothoracic Vascular Surgery)

## 2011-02-11 NOTE — Medication Information (Signed)
Summary: ccrv, last check 2/6  --agh  Anticoagulant Therapy  Managed by: Vashti Hey, RN PCP: [name] at [facility] Supervising MD: Myrtis Ser MD, Tinnie Gens Indication 1: Atrial Fibrillation Lab Used: [facility] [place] Site: [place] INR POC 1.9 INR RANGE 2.0-3.0  Dietary changes: no    Health status changes: no    Bleeding/hemorrhagic complications: no    Recent/future hospitalizations: no    Any changes in medication regimen? no    Recent/future dental: no  Any missed doses?: yes       Is patient compliant with meds? yes       Allergies: 1)  ! Codeine  Anticoagulation Management History:      The patient is taking warfarin and comes in today for a routine follow up visit.  The patient is taking warfarin for Atrial Fibrillation .  Positive risk factors for bleeding include an age of 67 years or older.  Negative risk factors for bleeding include no history of CVA/TIA, no history of GI bleeding, and absence of serious comorbidities.  The bleeding index is 'intermediate risk'.  Positive CHADS2 values include History of HTN.  Negative CHADS2 values include History of CHF, Age > 82 years old, History of Diabetes, and Prior Stroke/CVA/TIA.  The start date was 05/22/2010.  Her last INR was 1.30.  Anticoagulation responsible provider: Myrtis Ser MD, Tinnie Gens.  INR POC: 1.9.  Cuvette Lot#: [id number].  Exp: 07/2011.    Anticoagulation Management Assessment/Plan:      The patient's current anticoagulation dose is Warfarin sodium 5 mg tabs: Use as directed by Anticoagulation Clinic.  The target INR is 2.0-3.0.  The next INR is due 03/14/2011.  Anticoagulation instructions were given to patient.  Results were reviewed/authorized by Vashti Hey, RN.  She was notified by Vashti Hey RN.         Prior Anticoagulation Instructions: INR 2.5 Continue coumadin 10mg  once daily except 7.5mg  on Sundays  Current Anticoagulation Instructions: INR 1.9 Take coumadin 3 tablets tonight then  resume 2 tablets once daily except 1 1/2 tablets on Sundays Needs new coumadin Rx with increased [facility]anity [facility]

## 2011-02-12 LAB — BASIC METABOLIC PANEL
BUN: 7 mg/dL (ref 6–23)
BUN: 9 mg/dL (ref 6–23)
CO2: 32 mEq/L (ref 19–32)
Calcium: 8.4 mg/dL (ref 8.4–10.5)
Chloride: 103 mEq/L (ref 96–112)
Creatinine, Ser: 0.67 mg/dL (ref 0.4–1.2)
GFR calc non Af Amer: >60 mL/min (ref >60)
Glucose, Bld: 90 mg/dL (ref 70–99)
Glucose, Bld: 92 mg/dL (ref 70–99)
Potassium: 3.6 mEq/L (ref 3.5–5.1)

## 2011-02-12 LAB — COMPREHENSIVE METABOLIC PANEL
ALT: 18 U/L (ref 0–35)
Alkaline Phosphatase: 42 U/L (ref 39–117)
BUN: 10 mg/dL (ref 6–23)
CO2: 33 mEq/L — ABNORMAL HIGH (ref 19–32)
Chloride: 100 mEq/L (ref 96–112)
Glucose, Bld: 85 mg/dL (ref 70–99)
Potassium: 3 mEq/L — ABNORMAL LOW (ref 3.5–5.1)
Sodium: 139 mEq/L (ref 135–145)
Total Bilirubin: 1 mg/dL (ref 0.3–1.2)

## 2011-02-12 LAB — CBC
HCT: 31.7 % — ABNORMAL LOW (ref 36.0–46.0)
Hemoglobin: 10.8 g/dL — ABNORMAL LOW (ref 12.0–15.0)
MCH: 29.8 pg (ref 26.0–34.0)
MCHC: 34.1 g/dL (ref 30.0–36.0)
RDW: 13.4 % (ref 11.5–15.5)

## 2011-02-12 LAB — LIPASE, BLOOD: Lipase: 47 U/L (ref 11–59)

## 2011-02-12 LAB — PROTIME-INR
INR: 1.17 (ref 0.00–1.49)
Prothrombin Time: 15.1 seconds (ref 11.6–15.2)
Prothrombin Time: 15.5 seconds — ABNORMAL HIGH (ref 11.6–15.2)

## 2011-02-13 LAB — CBC
HCT: 30.9 % — ABNORMAL LOW (ref 36.0–46.0)
HCT: 32.5 % — ABNORMAL LOW (ref 36.0–46.0)
HCT: 42.9 % (ref 36.0–46.0)
HCT: 39.5 % (ref 36.0–46.0)
Hemoglobin: 10.8 g/dL — ABNORMAL LOW (ref 12.0–15.0)
Hemoglobin: 10.9 g/dL — ABNORMAL LOW (ref 12.0–15.0)
Hemoglobin: 14.6 g/dL (ref 12.0–15.0)
Hemoglobin: 13.9 g/dL (ref 12.0–15.0)
MCH: 30 pg (ref 26.0–34.0)
MCH: 30.3 pg (ref 26.0–34.0)
MCH: 31 pg (ref 26.0–34.0)
MCHC: 33.5 g/dL (ref 30.0–36.0)
MCHC: 34.3 g/dL (ref 30.0–36.0)
MCV: 87.7 fL (ref 78.0–100.0)
MCV: 88.4 fL (ref 78.0–100.0)
MCV: 88.6 fL (ref 78.0–100.0)
MCV: 88.8 fL (ref 78.0–100.0)
MCV: 88 fL (ref 78.0–100.0)
Platelets: 108 10*3/uL — ABNORMAL LOW (ref 150–400)
Platelets: 114 10*3/uL — ABNORMAL LOW (ref 150–400)
Platelets: 121 10*3/uL — ABNORMAL LOW (ref 150–400)
Platelets: 87 10*3/uL — ABNORMAL LOW (ref 150–400)
Platelets: 209 10*3/uL (ref 150–400)
RBC: 3.7 MIL/uL — ABNORMAL LOW (ref 3.87–5.11)
RBC: 4.24 MIL/uL (ref 3.87–5.11)
RBC: 4.84 MIL/uL (ref 3.87–5.11)
RBC: 4.49 MIL/uL (ref 3.87–5.11)
RDW: 13.4 % (ref 11.5–15.5)
RDW: 13.4 % (ref 11.5–15.5)
RDW: 13.5 % (ref 11.5–15.5)
RDW: 13.5 % (ref 11.5–15.5)
RDW: 13.7 % (ref 11.5–15.5)
WBC: 12.2 10*3/uL — ABNORMAL HIGH (ref 4.0–10.5)
WBC: 12.7 10*3/uL — ABNORMAL HIGH (ref 4.0–10.5)
WBC: 13.8 10*3/uL — ABNORMAL HIGH (ref 4.0–10.5)
WBC: 9.6 10*3/uL (ref 4.0–10.5)
WBC: 7.4 10*3/uL (ref 4.0–10.5)

## 2011-02-13 LAB — BASIC METABOLIC PANEL
BUN: 7 mg/dL (ref 6–23)
BUN: 7 mg/dL (ref 6–23)
BUN: 9 mg/dL (ref 6–23)
CO2: 28 mEq/L (ref 19–32)
Chloride: 96 mEq/L (ref 96–112)
Chloride: 106 mEq/L (ref 96–112)
Creatinine, Ser: 0.85 mg/dL (ref 0.4–1.2)
GFR calc Af Amer: >60 mL/min (ref >60)
GFR calc Af Amer: >60 mL/min (ref >60)
GFR calc Af Amer: >60 mL/min (ref >60)
GFR calc non Af Amer: >60 mL/min (ref >60)
GFR calc non Af Amer: >60 mL/min (ref >60)
GFR calc non Af Amer: >60 mL/min (ref >60)
Glucose, Bld: 110 mg/dL — ABNORMAL HIGH (ref 70–99)
Potassium: 3.5 mEq/L (ref 3.5–5.1)
Potassium: 3.8 mEq/L (ref 3.5–5.1)
Potassium: 4 mEq/L (ref 3.5–5.1)
Sodium: 133 mEq/L — ABNORMAL LOW (ref 135–145)
Sodium: 135 mEq/L (ref 135–145)
Sodium: 139 mEq/L (ref 135–145)

## 2011-02-13 LAB — POCT I-STAT 3, VENOUS BLOOD GAS (G3P V)
Acid-Base Excess: 2 mmol/L (ref 0.0–2.0)
Bicarbonate: 25.7 mEq/L — ABNORMAL HIGH (ref 20.0–24.0)
O2 Saturation: 60 %
O2 Saturation: 64 %
TCO2: 27 mmol/L (ref 0–100)
TCO2: 30 mmol/L (ref 0–100)
pCO2, Ven: 47.5 mmHg (ref 45.0–50.0)
pCO2, Ven: 48 mmHg (ref 45.0–50.0)
pH, Ven: 7.34 — ABNORMAL HIGH (ref 7.250–7.300)
pO2, Ven: 33 mmHg (ref 30.0–45.0)

## 2011-02-13 LAB — BLOOD GAS, ARTERIAL
Acid-Base Excess: 2.2 mmol/L — ABNORMAL HIGH (ref 0.0–2.0)
Bicarbonate: 26.2 mEq/L — ABNORMAL HIGH (ref 20.0–24.0)
TCO2: 27.5 mmol/L (ref 0–100)
pCO2 arterial: 40.6 mmHg (ref 35.0–45.0)
pH, Arterial: 7.426 — ABNORMAL HIGH (ref 7.350–7.400)
pO2, Arterial: 83.6 mmHg (ref 80.0–100.0)

## 2011-02-13 LAB — URINALYSIS, ROUTINE W REFLEX MICROSCOPIC
Bilirubin Urine: NEGATIVE
Glucose, UA: NEGATIVE mg/dL
Nitrite: NEGATIVE
Specific Gravity, Urine: 1.015 (ref 1.005–1.030)
pH: 6.5 (ref 5.0–8.0)

## 2011-02-13 LAB — TYPE AND SCREEN: Unit division: 0

## 2011-02-13 LAB — POCT I-STAT 4, (NA,K, GLUC, HGB,HCT)
Glucose, Bld: 117 mg/dL — ABNORMAL HIGH (ref 70–99)
Glucose, Bld: 79 mg/dL (ref 70–99)
HCT: 25 % — ABNORMAL LOW (ref 36.0–46.0)
HCT: 26 % — ABNORMAL LOW (ref 36.0–46.0)
HCT: 29 % — ABNORMAL LOW (ref 36.0–46.0)
HCT: 36 % (ref 36.0–46.0)
Hemoglobin: 12.2 g/dL (ref 12.0–15.0)
Hemoglobin: 8.8 g/dL — ABNORMAL LOW (ref 12.0–15.0)
Hemoglobin: 9.9 g/dL — ABNORMAL LOW (ref 12.0–15.0)
Potassium: 3.8 mEq/L (ref 3.5–5.1)
Potassium: 4.2 mEq/L (ref 3.5–5.1)
Potassium: 4.2 mEq/L (ref 3.5–5.1)
Sodium: 137 mEq/L (ref 135–145)
Sodium: 140 mEq/L (ref 135–145)
Sodium: 140 mEq/L (ref 135–145)
Sodium: 140 mEq/L (ref 135–145)
Sodium: 142 mEq/L (ref 135–145)

## 2011-02-13 LAB — MAGNESIUM
Magnesium: 2.4 mg/dL (ref 1.5–2.5)
Magnesium: 2.6 mg/dL — ABNORMAL HIGH (ref 1.5–2.5)

## 2011-02-13 LAB — POCT I-STAT 3, ART BLOOD GAS (G3+)
Bicarbonate: 21.4 mEq/L (ref 20.0–24.0)
Bicarbonate: 23.3 mEq/L (ref 20.0–24.0)
Bicarbonate: 26.4 mEq/L — ABNORMAL HIGH (ref 20.0–24.0)
O2 Saturation: 100 %
O2 Saturation: 91 %
Patient temperature: 99.1
TCO2: 28 mmol/L (ref 0–100)
pCO2 arterial: 38 mmHg (ref 35.0–45.0)
pCO2 arterial: 41.1 mmHg (ref 35.0–45.0)
pCO2 arterial: 41.2 mmHg (ref 35.0–45.0)
pCO2 arterial: 43.2 mmHg (ref 35.0–45.0)
pH, Arterial: 7.326 — ABNORMAL LOW (ref 7.350–7.400)
pH, Arterial: 7.386 (ref 7.350–7.400)
pO2, Arterial: 55 mmHg — ABNORMAL LOW (ref 80.0–100.0)
pO2, Arterial: 62 mmHg — ABNORMAL LOW (ref 80.0–100.0)

## 2011-02-13 LAB — POCT I-STAT GLUCOSE
Glucose, Bld: 126 mg/dL — ABNORMAL HIGH (ref 70–99)
Operator id: 3406
Operator id: 3406

## 2011-02-13 LAB — HEMOGLOBIN A1C
Hgb A1c MFr Bld: 5.5 % (ref <5.7)
Mean Plasma Glucose: 111 mg/dL (ref <117)

## 2011-02-13 LAB — GLUCOSE, CAPILLARY
Glucose-Capillary: 112 mg/dL — ABNORMAL HIGH (ref 70–99)
Glucose-Capillary: 116 mg/dL — ABNORMAL HIGH (ref 70–99)
Glucose-Capillary: 116 mg/dL — ABNORMAL HIGH (ref 70–99)
Glucose-Capillary: 117 mg/dL — ABNORMAL HIGH (ref 70–99)
Glucose-Capillary: 130 mg/dL — ABNORMAL HIGH (ref 70–99)
Glucose-Capillary: 131 mg/dL — ABNORMAL HIGH (ref 70–99)
Glucose-Capillary: 136 mg/dL — ABNORMAL HIGH (ref 70–99)
Glucose-Capillary: 143 mg/dL — ABNORMAL HIGH (ref 70–99)
Glucose-Capillary: 87 mg/dL (ref 70–99)
Glucose-Capillary: 95 mg/dL (ref 70–99)

## 2011-02-13 LAB — PROTIME-INR
INR: 1.22 (ref 0.00–1.49)
Prothrombin Time: 19 seconds — ABNORMAL HIGH (ref 11.6–15.2)
Prothrombin Time: 19.1 seconds — ABNORMAL HIGH (ref 11.6–15.2)

## 2011-02-13 LAB — POCT I-STAT, CHEM 8
BUN: 8 mg/dL (ref 6–23)
Hemoglobin: 10.5 g/dL — ABNORMAL LOW (ref 12.0–15.0)
Potassium: 3.3 mEq/L — ABNORMAL LOW (ref 3.5–5.1)
Sodium: 145 mEq/L (ref 135–145)
TCO2: 20 mmol/L (ref 0–100)

## 2011-02-13 LAB — COMPREHENSIVE METABOLIC PANEL
ALT: 16 U/L (ref 0–35)
Alkaline Phosphatase: 59 U/L (ref 39–117)
BUN: 7 mg/dL (ref 6–23)
CO2: 27 mEq/L (ref 19–32)
Chloride: 105 mEq/L (ref 96–112)
Glucose, Bld: 98 mg/dL (ref 70–99)
Potassium: 4.3 mEq/L (ref 3.5–5.1)
Sodium: 140 mEq/L (ref 135–145)
Total Bilirubin: 1.3 mg/dL — ABNORMAL HIGH (ref 0.3–1.2)
Total Protein: 7.2 g/dL (ref 6.0–8.3)

## 2011-02-13 LAB — URINE MICROSCOPIC-ADD ON

## 2011-02-13 LAB — HEMOGLOBIN AND HEMATOCRIT, BLOOD: Hemoglobin: 9 g/dL — ABNORMAL LOW (ref 12.0–15.0)

## 2011-02-13 LAB — CREATININE, SERUM: GFR calc Af Amer: >60 mL/min (ref >60)

## 2011-02-13 LAB — ABO/RH: ABO/RH(D): B POS

## 2011-02-13 LAB — PLATELET COUNT: Platelets: 110 10*3/uL — ABNORMAL LOW (ref 150–400)

## 2011-02-13 LAB — APTT: aPTT: 38 seconds — ABNORMAL HIGH (ref 24–37)

## 2011-03-13 ENCOUNTER — Encounter: Payer: Self-pay | Admitting: Cardiology

## 2011-03-13 DIAGNOSIS — I4891 Unspecified atrial fibrillation: Secondary | ICD-10-CM

## 2011-03-13 DIAGNOSIS — I4892 Unspecified atrial flutter: Secondary | ICD-10-CM

## 2011-03-13 DIAGNOSIS — I059 Rheumatic mitral valve disease, unspecified: Secondary | ICD-10-CM

## 2011-03-13 DIAGNOSIS — Z7901 Long term (current) use of anticoagulants: Secondary | ICD-10-CM

## 2011-03-14 ENCOUNTER — Ambulatory Visit (INDEPENDENT_AMBULATORY_CARE_PROVIDER_SITE_OTHER): Payer: Medicare Other | Admitting: *Deleted

## 2011-03-14 ENCOUNTER — Encounter: Payer: Self-pay | Admitting: Cardiology

## 2011-03-14 ENCOUNTER — Ambulatory Visit (INDEPENDENT_AMBULATORY_CARE_PROVIDER_SITE_OTHER): Payer: Medicare Other | Admitting: Cardiology

## 2011-03-14 VITALS — BP 125/82 | HR 61 | Ht 68.0 in | Wt 177.0 lb

## 2011-03-14 DIAGNOSIS — I359 Nonrheumatic aortic valve disorder, unspecified: Secondary | ICD-10-CM

## 2011-03-14 DIAGNOSIS — I059 Rheumatic mitral valve disease, unspecified: Secondary | ICD-10-CM

## 2011-03-14 DIAGNOSIS — I10 Essential (primary) hypertension: Secondary | ICD-10-CM

## 2011-03-14 DIAGNOSIS — I4891 Unspecified atrial fibrillation: Secondary | ICD-10-CM

## 2011-03-14 DIAGNOSIS — I4892 Unspecified atrial flutter: Secondary | ICD-10-CM

## 2011-03-14 DIAGNOSIS — Z7901 Long term (current) use of anticoagulants: Secondary | ICD-10-CM

## 2011-03-14 LAB — POCT INR: INR: 1.5

## 2011-03-14 MED ORDER — WARFARIN SODIUM 5 MG PO TABS: ORAL_TABLET | ORAL | Status: DC

## 2011-03-14 NOTE — Assessment & Plan Note (Signed)
Hypertension: I told the patient we will recheck her blood pressure. Lisinopril may need to be increased from 5-10 mg.

## 2011-03-14 NOTE — Assessment & Plan Note (Addendum)
We'll continue Coumadin. Copy of her EKG will be provided to Dr. Cornelius Moras

## 2011-03-14 NOTE — Patient Instructions (Signed)
   Continue all current medications.  Copy of EKG given.  Recheck BP was 125/82  HR 61  Your physician wants you to follow up in: 6 months.  You will receive a reminder letter in the mail one-two months in advance.  If you don't receive a letter, please call our office to schedule the follow up appointment

## 2011-03-14 NOTE — Progress Notes (Signed)
HPI the patient is a 65 year old female status post mini thoracotomy for mitral valve repair with a complex repair including a quadrangular resection of the posterior leaflet and patch augmentation.  She also received artificial Gore-Tex chordae replacement x 2 and the 32-mm annuloplasty ring.  She is status post a left-sided maze procedure.  She remains in normal sinus rhythm. Surgery was performed in October 2011. The patient has a prior history of paroxysmal atrial fibrillation. The patient has remained on Coumadin. She was last seen in the office in February of 2012. Her blood pressure was elevated during his office visit. The patient has been doing well. She has been going to cardiac rehabilitation. She reports no chest pain or shortness of breath. She does some pain in the right flank of her incision site she will discuss with Dr. Cornelius Moras. She is not taking any pain medications for this however. She remains in normal sinus rhythm. Her electrocardiogram was reviewed.  Allergies  Allergen Reactions  . Codeine     REACTION: palpitations,vomiting,sweating    Current Outpatient Prescriptions on File Prior to Visit  Medication Sig Dispense Refill  . aspirin 81 MG tablet Take 81 mg by mouth daily.        . cetirizine (ZYRTEC) 10 MG tablet Take 10 mg by mouth daily as needed.       . clonazePAM (KLONOPIN) 0.5 MG tablet Take 0.25 mg by mouth at bedtime as needed.       . fluticasone (FLONASE) 50 MCG/ACT nasal spray 2 sprays by Nasal route daily.        Marland Kitchen guaifenesin (HUMIBID E) 400 MG TABS Take 400 mg by mouth every 4 (four) hours as needed.       Marland Kitchen levothyroxine (SYNTHROID, LEVOTHROID) 125 MCG tablet Take 125 mcg by mouth daily.        Marland Kitchen lisinopril (PRINIVIL,ZESTRIL) 5 MG tablet Take 5 mg by mouth daily.        . metoprolol (LOPRESSOR) 50 MG tablet Take 50 mg by mouth 2 (two) times daily.        . traMADol (ULTRAM) 50 MG tablet Take 50 mg by mouth every 6 (six) hours as needed.        Marland Kitchen DISCONTD:  warfarin (COUMADIN) 5 MG tablet Take 5 mg by mouth daily. As directed       . DISCONTD: famotidine (PEPCID) 10 MG tablet Take 10 mg by mouth 2 (two) times daily.          Past Medical History  Diagnosis Date  . Arrhythmia     paroxysmal atrial fib normal sinus rhythm as off oct 2011  . Hypothyroidism   . Pneumonia   . Mitral regurgitation     aortic stenosis  . S/P mitral valve repair     post right mini thoractomy status post quadrangular resection and patch repair of posterrior leaflet artificial Gore-Tex cord replacement times 2 32 mm sore and ring annulplasty prior maze procedure left side lesion site.oct 2011  . Acquired inhibitor of coagulation     coumadin therapy    Past Surgical History  Procedure Date  . Arthroscopic left knee surgery     No family history on file.  History   Social History  . Marital Status: Married    Spouse Name: N/A    Number of Children: N/A  . Years of Education: N/A   Occupational History  .      lives with husband locally   Social History Main  Topics  . Smoking status: Never Smoker   . Smokeless tobacco: Not on file  . Alcohol Use: No  . Drug Use: No  . Sexually Active: Not on file   Other Topics Concern  . Not on file   Social History Narrative  . No narrative on file   Review of systems:Pertinent positives as outlined above. The remainder of the 18  point review of systems is negative   PHYSICAL EXAM BP 125/82  Pulse 61  Ht 5\' 8"  (1.727 m)  Wt 177 lb (80.287 kg)  BMI 26.91 kg/m2 General: Well-developed, well-nourished in no distress Head: Normocephalic and atraumatic Eyes:PERRLA/EOMI intact, conjunctiva and lids normal Ears: No deformity or lesions Mouth:normal dentition, normal posterior pharynx Neck: Supple, no JVD.  No masses, thyromegaly or abnormal cervical nodes Lungs: Normal breath sounds bilaterally without wheezing.  Normal percussion Cardiac: regular rate and rhythm with normal S1 and S2, no S3 or S4.   PMI is normal.  No pathological murmurs Abdomen: Normal bowel sounds, abdomen is soft and nontender without masses, organomegaly or hernias noted.  No hepatosplenomegaly MSK: Back normal, normal gait muscle strength and tone normal Vascular: Pulse is normal in all 4 extremities Extremities: No peripheral pitting edema Neurologic: Alert and oriented x 3 Skin: Intact without lesions or rashes Lymphatics: No significant adenopathy Psychologic: Normal affect   ECG: Normal sinus rhythm no acute changes.  ASSESSMENT AND PLAN

## 2011-03-17 ENCOUNTER — Encounter (INDEPENDENT_AMBULATORY_CARE_PROVIDER_SITE_OTHER): Payer: Medicare Other | Admitting: Thoracic Surgery (Cardiothoracic Vascular Surgery)

## 2011-03-17 DIAGNOSIS — I059 Rheumatic mitral valve disease, unspecified: Secondary | ICD-10-CM

## 2011-03-17 DIAGNOSIS — I4891 Unspecified atrial fibrillation: Secondary | ICD-10-CM

## 2011-03-18 NOTE — Assessment & Plan Note (Signed)
OFFICE VISIT  Tracy Acosta, Tracy Acosta DOB:  1946/10/06                                        March 17, 2011 CHART #:  [id number]  HISTORY OF PRESENT ILLNESS:  The patient returns for routine followup, status post right miniature thoracotomy for mitral valve repair and maze procedure on September 10, 2010.  She was last seen here in the office December 16, 2010.  Since then, she has continued to do well.  She states that she had an echocardiogram performed at [facility] in [place] for routine followup per Dr. [name].  She is not aware of the results of that exam.  Clinically, she has continued to do exceptionally well.  She has completed her cardiac rehab program and is getting along fine.  She is not having any tachy palpitations or dizzy spells.  She is not having any shortness of breath.  She still has mild soreness in the right chest wall related to her mini thoracotomy, but overall this continues to improve and she is getting along fine.  She has no complaints.  The remainder of her past medical history is unchanged.  CURRENT MEDICATIONS:  Aspirin, warfarin, calcium, Zyrtec, clonazepam, fluticasone, Humibid as needed, levothyroxine, lisinopril, metoprolol.  PHYSICAL EXAMINATION:  General:  Notable for well-appearing female. Vital Signs:  Blood pressure 140/84, pulse 62 and regular.  Two channel telemetry rhythm strip demonstrates normal sinus rhythm.  Oxygen saturation is 97% on room air.  Chest:  Examination of the chest reveals clear breath sounds that are symmetrical bilaterally.  No wheezes, rales or rhonchi are noted.  Cardiovascular:  Regular rate and rhythm.  No murmurs, rubs or gallops are appreciated.  Abdomen:  Soft and nontender. Extremities:  Warm and well-perfused.  There is no lower extremity edema.  IMPRESSION:  The patient seems to be doing very well now 6 months following mitral valve repair and maze procedure.  She is maintaining sinus  rhythm.  PLAN:  We will have the patient return in 6 months' time for routine followup and rhythm check.  We will get results of the recent echocardiogram performed for review.  All of her questions have been addressed.  Salvatore Decent. Cornelius Moras, M.D. Electronically Signed  CHO/MEDQ  D:  03/17/2011  T:  03/18/2011  Job:  [id number]  cc:   Learta Codding, MD,FACC

## 2011-04-04 ENCOUNTER — Ambulatory Visit (INDEPENDENT_AMBULATORY_CARE_PROVIDER_SITE_OTHER): Payer: Medicare Other | Admitting: *Deleted

## 2011-04-04 DIAGNOSIS — I4892 Unspecified atrial flutter: Secondary | ICD-10-CM

## 2011-04-04 DIAGNOSIS — Z7901 Long term (current) use of anticoagulants: Secondary | ICD-10-CM

## 2011-04-04 DIAGNOSIS — I4891 Unspecified atrial fibrillation: Secondary | ICD-10-CM

## 2011-04-04 DIAGNOSIS — I059 Rheumatic mitral valve disease, unspecified: Secondary | ICD-10-CM

## 2011-04-15 NOTE — Consult Note (Signed)
NEW PATIENT CONSULTATION   JANAH, MCCULLOH B  DOB:  28-Dec-1945                                        September 02, 2010  CHART #:  [id number]   Date of planned hospital admission and surgery, September 10, 2010.   REASON FOR CONSULTATION:  Severe mitral regurgitation with recurrent  paroxysmal atrial fibrillation.   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  The patient had never been formally  referred to a cardiologist until this past June.  She presented with  tachy palpitations and rapid atrial fibrillation associated with resting  shortness of breath and congestive heart failure.  She was hospitalized  at [facility] in [place] where she was also thought to have possible  community-acquired pneumonia.  She was started on oral Coumadin therapy  for paroxysmal atrial fibrillation.  Since then, she has had several  recurrent episodes of tachy palpitations associated with shortness of  breath.  Interspersed with these episodes, she also complains of  progression of exertional shortness of breath and fatigue.  She has not  had resting shortness of breath nor orthopnea except that which occurs  in the setting of tachy palpitations during spells of rapid atrial  fibrillation.  She returned to see Dr. [name] at [facility] in [place] on August 13, 2010, with another episode of tachy  palpitation associated with shortness of breath.  She was found to be in  atrial fibrillation and atrial flutter.  Her rhythm spontaneously  converted back to sinus rhythm.  She subsequently underwent  transesophageal echocardiogram by Dr. [name] on August 14, 2010.  This revealed mitral valve prolapse with severe (4+) mitral  regurgitation.  There was evidence of a flail  segment of the posterior  leaflet (P3) by report with an eccentric jet of regurgitation.  Left  ventricular systolic function was normal.  The aortic valve was normal  and there was no sign of aortic stenosis.  Tricuspid valve appeared  normal and there was only trace tricuspid regurgitation.  The patient  was subsequently scheduled for elective left and right heart  catheterization.  This was performed on August 19, 2010, by Dr.  Juanda Chance at [facility].  Findings at the time of  catheterization include normal coronary artery anatomy with no  significant coronary artery disease.  Left ventriculogram revealed  normal left ventricular systolic function with ejection fraction  calculated 65%.  There appeared to be severe (4+) mitral regurgitation.  There was pulmonary hypertension with PA pressures measured 54/16,  pulmonary capillary wedge pressure of 20 with large V-waves of 33.  Forward cardiac output remained normal at 4.7 L per minute by the method  of Fick and 4.3 L per minute using thermodilution.  The patient has now  been referred to consider elective mitral valve repair and maze  procedure.   REVIEW OF SYSTEMS:  GENERAL:  The patient reports normal appetite.  She  is 5 feet 9 inches tall and weighs approximately 195 pounds.  She states  that she has gradually gained weight over the last 10 years or so, but  nothing sudden recently.  CARDIAC:  Notable for intermittent  tachy palpitations associated with  resting shortness of breath and anxiety.  The patient describes stable,  but mild progression of symptoms of exertional shortness of breath that  has occurred over several years as well.  The patient denies PND,  orthopnea, or lower extremity edema.  She has occasional dizzy spells  associated with tachy palpitations.  She has not had any syncopal  episodes.  She has never had any chest pain.  RESPIRATORY:  Negative.  The patient denies productive cough,   hemoptysis, or wheezing.  GASTROINTESTINAL:  Notable for some chronic symptoms of reflux as well  as epigastric pressure that is exacerbated by meals just before bedtime.  She is careful not to eat a large meal before bedtime.  She had no  difficulty swallowing.  She reports normal bowel function.  She denies  hematochezia, hematemesis, or melena.  GENITOURINARY:  Negative.  MUSCULOSKELETAL:  Notable for some numbness and tingling and pain in  both lower legs that is worse at night when she sleeps and occasionally  associated with a jumping, a spasm-like sensation suggestive of restless  legs.  The patient does have chronic pain in her right knee as well as  some chronic arthritis afflicting both hands.  NEUROLOGIC:  Negative.  PSYCHIATRIC:  Notable for some anxiety.  HEENT:  Negative.  The patient sees her dentist on a regular basis and  in fact just had a dental cleaning this past week.  Her dentist is aware  of her heart murmur.  The remainder of her review of systems is entirely  unremarkable.   PAST MEDICAL HISTORY:  1. Mitral valve prolapse with mitral regurgitation.  2. Recurrent paroxysmal atrial fibrillation.  3. Hypertension.  4. Congestive heart failure.  5. GE reflux disease with hiatal hernia.  6. Hypothyroidism.  7. Anxiety.   PAST SURGICAL HISTORY:  1. Arthroscopic surgery, right knee.  2. Tonsillectomy.  3. Hysterectomy.   FAMILY HISTORY:  Notable in that several family members have had mitral  valve prolapse and mitral regurgitation.   SOCIAL HISTORY:  The patient is married and lives with her husband in  [place], [place].  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  She has 2 grown children and several grandchildren.  She  is a nonsmoker.  She denies any alcohol consumption.   CURRENT MEDICATIONS:  1. Metoprolol 50 mg twice daily.  2. Cardizem CD 180 mg daily.  3. Warfarin 5 mg daily or as directed.  4. Levothyroxine 125 mcg daily  (held x2 weeks).  5. Nasonex spray as needed.  6. Zyrtec 10 mg daily as needed.  7. Mucinex 1 tablet at bedtime as needed.  8. Furosemide 20 mg daily as needed.  9. Xanax 0.25 mg daily as needed.  10.Prilosec 40 mg daily.  11.Spironolactone 25 mg daily.   DRUG ALLERGIES:  Codeine and hydrocodone, both cause exacerbation of  tachy palpitations and nausea.   PHYSICAL EXAMINATION:  Notable for a well-appearing female who appears  her stated age, in no acute distress.  Blood pressure is 130/84, pulse  55 and regular, and oxygen saturation 97% on room air.  HEENT exam is  unrevealing.  She has good dentition.  The neck is supple.  There is no  cervical nor supraclavicular lymphadenopathy.  There is no jugular  venous distention.  No carotid bruits noted.  Auscultation of the chest  reveals clear breath sounds that are symmetrical bilaterally.  No  wheezes, rales, or rhonchi are noted.  Cardiovascular exam demonstrates  regular rate and rhythm.  There is a prominent grade 4/6 holosystolic  murmur that is heard all across the precordium with radiation both to  the right sternal border as well as to the axilla and back.  No  diastolic murmurs are noted.  The abdomen is soft, nondistended, and  nontender.  Bowel sounds are present.  There is a fairly large hematoma  in the right groin from recent catheterization.  Femoral pulses palpable  in both sides.  Distal pulses are palpable in the posterior tibial  position, although slightly diminished.  Femoral pulses are slightly  diminished on the right.  There is mild chronic varicose veins and  venous changes in both lower legs around the ankle with trace bilateral  lower extremity edema.  The skin is otherwise clean and healthy  appearing throughout.  Rectal and GU exams are both deferred.  Neurologic examination is grossly nonfocal and symmetrical throughout.   DIAGNOSTIC TESTS:  Report of transesophageal echocardiogram performed by  Dr.  [name] at [facility] in [place] on August 14, 2010, is  reviewed.  The films of this exam are not currently available for direct  review.  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  The aortic valve apparently  appeared normal.  Left ventricular systolic function appeared normal  with ejection fraction greater than 65%.  There was intermittent  spontaneous echo contrast visualized in the left atrium.  There is no  left atrial thrombus.  There is trivial tricuspid regurgitation.  No  other significant abnormalities are noted.   Left and right heart catheterization performed by Dr. Juanda Chance on  August 19, 2010, is reviewed.  This confirmed the presence of normal  coronary artery anatomy with no significant coronary artery disease.  There is right-dominant coronary circulation.  There is severe mitral  regurgitation.  There is pulmonary hypertension with pressure data as  noted previously.   IMPRESSION:  Mitral valve prolapse with severe (4+) mitral regurgitation  in the setting of gradual progression of symptoms of exertional  shortness of breath and recent onset of recurrent paroxysmal atrial  fibrillation.  I agree that the patient would best be treated with  elective mitral valve repair and maze procedure.  She may be a good  candidate for minimally invasive approach.   PLAN:  I have discussed the options at length with the patient and her  husband here in the office today.  Alternative treatment strategies have  been discussed.  Prognosis with continued medical therapy has been  contrasted with surgical intervention.  Alternative surgical approaches  have been reviewed including the minimally invasive approach versus  conventional sternotomy.  All of their questions have  been addressed.  The patient is eager to proceed with surgery as soon as practical.  We  will obtain CT angiogram of the thoracic and abdominal aorta and iliac  vessels to rule out the presence of significant aortoiliac disease which  would preclude the use of femoral artery cannulation at the time of  surgery.  We will go ahead and start the patient on amiodarone prior to  surgery and instruct her to stop taking Coumadin tomorrow.  We  tentatively plan to proceed with surgery on Tuesday, September 10, 2010.  The patient and her husband will return to the  office to review her CT  angiogram results and answer any final questions on Monday, September 09, 2010.  All of their questions have been addressed.   Salvatore Decent. Cornelius Moras, M.D.  Electronically Signed   CHO/MEDQ  D:  09/02/2010  T:  09/03/2010  Job:  [id number]   cc:   Learta Codding, MD,FACC  Everardo Beals. Juanda Chance, MD, [facility]  Georgana Curio, FNP

## 2011-04-15 NOTE — Assessment & Plan Note (Signed)
OFFICE VISIT   Tracy Acosta, WACHS B  DOB:  15-Nov-1946                                        November 04, 2010  CHART #:  [id number]   HISTORY:  The patient returns for routine followup status post right  miniature thoracotomy for mitral valve repair and maze procedure on  September 10, 2010.  She was last seen here in the office on September 23, 2010.  Since then, she has continued to improve.  She states that it  took several weeks for her appetite to come around, but for the last  week or 2, she has been eating normally.  She has not had any shortness  of breath.  She has not had any further tachy palpitations to suggest  the recurrence of atrial fibrillation.  She has had minimal pain,  reports only mild pain in her right lateral chest wall and shoulder.  She is no longer taking any sort of pain relievers for this and it does  not seem to be slowing her down much.  She does report that her visual  acuity has been off a little bit ever since the surgery.  She states  that there is just a slight blurriness to her vision that involves both  eyes, does not seem to be restricted to any sort of visual fields.  It  seems to get worse as the day goes on, suggesting that her accommodation  fatigues over time.  She is not having any dizzy spells.  She states  that she is still sleeping in a recliner as she cannot sleep in bed  unless she can lay flat on her right side and she cannot lay comfortably  on her right side because of the surgery.  She was seen in followup by  Dr. [name] and has been cleared to start the cardiac rehab program.  Otherwise, she has no complaints.  The remainder of her review of  systems is unremarkable.  She has had her Coumadin dose monitored and  adjusted through Dr. Margarita Mail office in [place].  She states that she  sometimes has difficulty sleeping at night and she wonders if this might  be related to taking an evening dose of metoprolol.  If she  takes  clonazepam, she sleeps quite well and without difficulty.  The remainder  of her review of systems is unremarkable.   PHYSICAL EXAMINATION:  Notable for well-appearing female.  Blood  pressure 128/80, pulse 76 and regular, and oxygen saturation 97% on room  air.  Examination of the chest reveals a mini thoracotomy incision that  is healing nicely.  Auscultation reveals clear breath sounds that are  symmetrical bilaterally.  No wheezes, rales, or rhonchi are noted.  Cardiovascular exam is notable for regular rate and rhythm.  No murmurs,  rubs, or gallops are appreciated.  The abdomen is soft and nontender.  The left groin incision has healed nicely.  There is no lower extremity  edema.  The remainder of her physical exam is unremarkable.   IMPRESSION:  Excellent progress following mitral valve repair and maze  procedure.  The patient appears to be maintaining sinus rhythm.  Overall, she is getting along quite well.  She does report a slight  decrease in her visual acuity of unclear significance.  Her previous  problems with difficulty swallowing seem  to have resolved.  She has no  new complaints.   PLAN:  I have encouraged the patient to continue to increase her  physical activity as tolerated without any particular limitations at  this time.  She is asked about driving an automobile, and under the  circumstances, I have suggested that she should go ahead to see her eye  doctor to get her eyes checked to make sure that there are not any  significant problems with her visual acuity.  Otherwise, there is no  reason why she cannot be driving a car.  I have also suggested that  there is concern that her visual acuity is noticeably off.  We could  consider getting an MRI of the brain to make sure that there is no sign  of a stroke.  However, her symptoms did not seem suggestive of focal  neurologic deficit and overall she seems to be getting along quite well.  She does not want to  pursue this any further at this time.  All of her  questions have been addressed.  We have not made any changes in her  medications.  We will plan to see her back in 8 weeks for further  followup and rhythm check.   Tracy Acosta, M.D.  Electronically Signed   CHO/MEDQ  D:  11/04/2010  T:  11/05/2010  Job:  [id number]   cc:   Tracy Codding, MD,FACC  Tracy Beals. Juanda Chance, MD, [facility]  Tracy Curio, NP

## 2011-04-15 NOTE — Assessment & Plan Note (Signed)
OFFICE VISIT   SHAMIR, SEDLAR B  DOB:  11/08/1946                                        September 23, 2010  CHART #:  [id number]   HISTORY OF PRESENT ILLNESS:  The patient returns for followup status  post right miniature thoracotomy for mitral valve repair and Cox maze  procedure on September 10, 2010.  The patient went home last weekend.  Since then, she has continued to be progressing reasonably well.  However, she never did get her prothrombin time level checked at the  [facility] office in [place] as this had been supposed to be  arranged.  She returns to our office for followup today.  She states  that she still has some intermittent nausea, but this has been gradually  improving.  Her appetite is not good, but she is eating and she claims  to be eating and drinking enough.  She has not had any shortness of  breath.  She has had minimal pain in her chest.  She has been getting up  and around and walking a fair amount in the house and not having any  significant problems.  She denies any cough.  The remainder of her  review of systems is unremarkable.  She states that she had one very  transient episode where she thought she felt her heart fluttering, but  this was very brief and did not last and she did not have any prolonged  tachy palpitation or sensation of rapid heartbeat.  The remainder of her  review of systems is unremarkable.   CURRENT MEDICATIONS:  1. Coumadin 5 mg daily.  2. Aspirin 81 mg daily.  3. Metoprolol 50 mg twice daily.  4. Zyrtec 10 mg daily.  5. Xanax as needed.  6. Prilosec 40 mg daily.   She has been using Ultram occasionally for chest wall discomfort.   PHYSICAL EXAMINATION:  Notable for well-appearing female with blood  pressure 114/78, pulse 82 and regular, and oxygen saturation 98% on room  air.  Examination of the chest is notable for mini thoracotomy incision  that is healing nicely.  Auscultation reveals clear  breath sounds that  are symmetrical bilaterally.  No wheezes, rales, or rhonchi are noted.  Cardiovascular exam includes regular rate and rhythm.  No murmurs, rubs,  or gallops are appreciated.  The abdomen is soft and nontender.  Bowel  sounds are present.  The extremities are warm and well perfused.  The  left groin incision is healing nicely.  There is no lower extremity  edema.   DIAGNOSTIC TESTS:  Chest x-ray performed today at the [facility] is reviewed.  This demonstrates improved aeration in both lung  fields.  There is some residual right lower lobe and right middle lobe  atelectasis, but overall the x-ray looks quite clear and improving  compared with last x-ray prior to hospital discharge.   IMPRESSION:  Excellent progress following mitral valve repair and maze  procedure.  The patient seems to be doing well.  She has not had her  prothrombin time checked since she left the hospital.   PLAN:  We will send the patient for prothrombin time, complete blood  count, and comprehensive metabolic panel.  We will make sure that the  results of her prothrombin time, be called to the [facility]  office in [place] and she has been instructed to make sure that a followup  prothrombin time be checked next week.  We will plan to see her back in  6 weeks.  During the interim period of time, I have encouraged her to  continue to gradually increase her physical activity as tolerated with  care to avoid any heavy lifting or strenuous physical activity.  All of  her questions have been addressed.   Salvatore Decent. Cornelius Moras, M.D.  Electronically Signed   CHO/MEDQ  D:  09/23/2010  T:  09/24/2010  Job:  [id number]   cc:   Learta Codding, MD,FACC  Everardo Beals. Juanda Chance, MD, [facility]  Georgana Curio, FNP

## 2011-04-15 NOTE — Assessment & Plan Note (Signed)
OFFICE VISIT   Tracy Acosta, Tracy Acosta  DOB:  1946/03/22                                        September 09, 2010  CHART #:  [id number]   The patient returns for further follow up with tentative plans to  proceed with surgery tomorrow.  She was originally seen in consultation  on September 02, 2010, and a full history and physical exam was dictated at  that time.  Since then, the patient has done well.  She underwent CT  angiogram of the chest, abdomen, and pelvis on September 03, 2010.  This  revealed no significant sign of atherosclerotic aortoiliac disease which  might preclude the use of femoral artery cannulation for surgery.  There  was some sigmoid diverticulosis and a 3.4-cm left ovarian cyst.  No  other significant abnormalities were noted.  There was mild  atherosclerotic calcifications in the infrarenal aorta.   I again reviewed the indications, risks, and potential benefits of  surgery with the patient.  Alternative treatment strategies have been  discussed.  The relative risks and benefits of minimally invasive  approach has been contrasted with conventional sternotomy.  She  understands that I feel there is a very high likelihood that her mitral  valve should be repairable.  In the small and unlikely event that  satisfactory repair cannot be achieved, we would recommend replacing her  valve using a mechanical prosthesis; however, this should be unlikely.  The patient understands and accepts all potential associated risks of  surgery including but not limited to risk of death, stroke, myocardial  infarction, congestive heart failure, respiratory failure, pneumonia,  bleeding requiring blood transfusion, possible need for conversion to a  full sternotomy, possible complications related to femoral artery  cannulation, possible late complications related to valve repair or  replacement, possible recurrence of atrial fibrillation.  All of her  questions have  been addressed.   Salvatore Decent. Cornelius Moras, M.D.  Electronically Signed   CHO/MEDQ  D:  09/09/2010  T:  09/10/2010  Job:  [id number]

## 2011-04-15 NOTE — Assessment & Plan Note (Signed)
OFFICE VISIT   Tracy Acosta, Tracy Acosta  DOB:  1946-09-05                                        December 16, 2010  CHART #:  [id number]   HISTORY:  The patient returns for followup, status post right miniature  thoracotomy for mitral valve repair and Maze procedure on September 10, 2010.  She was last seen here in the office November 04, 2010.  Since  then, she has continued to do quite well.  She is actively participating  in the cardiac rehab program and it sounds as though she is progressing  quite nicely.  She has not had any further episodes of tachy  palpitations.  She denies any shortness of breath at all.  She still has  mild soreness in her right chest.  This does not really bother her much  except to the fact that she cannot sleep on her right side in bed.  She  otherwise feels quite well and has no complaints.  The remainder of her  review of systems are unremarkable.   Current medications include metoprolol, warfarin, levothyroxine,  Klonopin, and aspirin.   PHYSICAL EXAMINATION:  General:  Notable for well-appearing female with  blood pressure 146/84, pulse is 74 and regular.  Two channel telemetry  rhythm strip demonstrates normal sinus rhythm.  Oxygen saturation is 98%  on room air.  Chest:  Examination of the chest reveals a mini  thoracotomy incision that has healed nicely.  Auscultation reveals clear  breath sounds that are symmetrical bilaterally.  No wheezes, rales or  rhonchi noted.  Cardiovascular:  Regular rate and rhythm.  No murmurs,  rubs or gallops are appreciated.  Abdomen:  Soft, nontender.  Extremities:  Warm and well perfused.  There is no lower extremity  edema.   IMPRESSION:  The patient is doing very well 3 months, status post mitral  valve repair and Maze procedure.  She is maintaining sinus rhythm.  Overall, she looks quite good.  To her knowledge, she has not had a  followup echocardiogram performed.  She is scheduled to see  Dr. [name]  for further followup next month.   PLAN:  We will plan to see the patient back in 3 months for routine  followup and a rhythm check.  We anticipate a routine followup  echocardiogram will likely be performed by Dr. [name] at her next office  visit.  We will review the results of this when she returns to see [place]  later.  Overall, she seems to be doing fine and at this point she has no  physical limitations with respect to her previous surgery.   Salvatore Decent. Cornelius Moras, M.D.  Electronically Signed   CHO/MEDQ  D:  12/16/2010  T:  12/17/2010  Job:  [id number]   cc:   Learta Codding, MD,FACC  Georgana Curio

## 2011-04-15 NOTE — Assessment & Plan Note (Signed)
OFFICE VISIT   Tracy Acosta, Tracy Acosta  DOB:  Apr 24, 1946                                        November 04, 2010  CHART #:  [id number]   ADDENDUM   The patient underwent followup chest x-ray at the [facility] today.  This demonstrates clear lung fields bilaterally.  There  are no pleural effusions.  No other abnormalities are noted.  Her x-ray  looks great.   Salvatore Decent. Cornelius Moras, M.D.  Electronically Signed   CHO/MEDQ  D:  11/04/2010  T:  11/05/2010  Job:  [id number]

## 2011-05-02 ENCOUNTER — Ambulatory Visit (INDEPENDENT_AMBULATORY_CARE_PROVIDER_SITE_OTHER): Payer: Medicare Other | Admitting: *Deleted

## 2011-05-02 DIAGNOSIS — I4891 Unspecified atrial fibrillation: Secondary | ICD-10-CM

## 2011-05-02 DIAGNOSIS — I059 Rheumatic mitral valve disease, unspecified: Secondary | ICD-10-CM

## 2011-05-02 DIAGNOSIS — I4892 Unspecified atrial flutter: Secondary | ICD-10-CM

## 2011-05-02 DIAGNOSIS — Z7901 Long term (current) use of anticoagulants: Secondary | ICD-10-CM

## 2011-05-02 LAB — POCT INR: INR: 2.5

## 2011-06-03 ENCOUNTER — Ambulatory Visit (INDEPENDENT_AMBULATORY_CARE_PROVIDER_SITE_OTHER): Payer: Medicare Other | Admitting: *Deleted

## 2011-06-03 DIAGNOSIS — I4892 Unspecified atrial flutter: Secondary | ICD-10-CM

## 2011-06-03 DIAGNOSIS — I059 Rheumatic mitral valve disease, unspecified: Secondary | ICD-10-CM

## 2011-06-03 DIAGNOSIS — Z7901 Long term (current) use of anticoagulants: Secondary | ICD-10-CM

## 2011-06-03 DIAGNOSIS — I4891 Unspecified atrial fibrillation: Secondary | ICD-10-CM

## 2011-06-03 LAB — POCT INR: INR: 2.7

## 2011-06-29 IMAGING — CR DG CHEST 1V PORT
1 series · 1 of 1 positions shown · non-contrast
Comparison: 09/13/2010.

CLINICAL DATA: Chest tube removal.

PORTABLE CHEST - 1 VIEW

[AP]
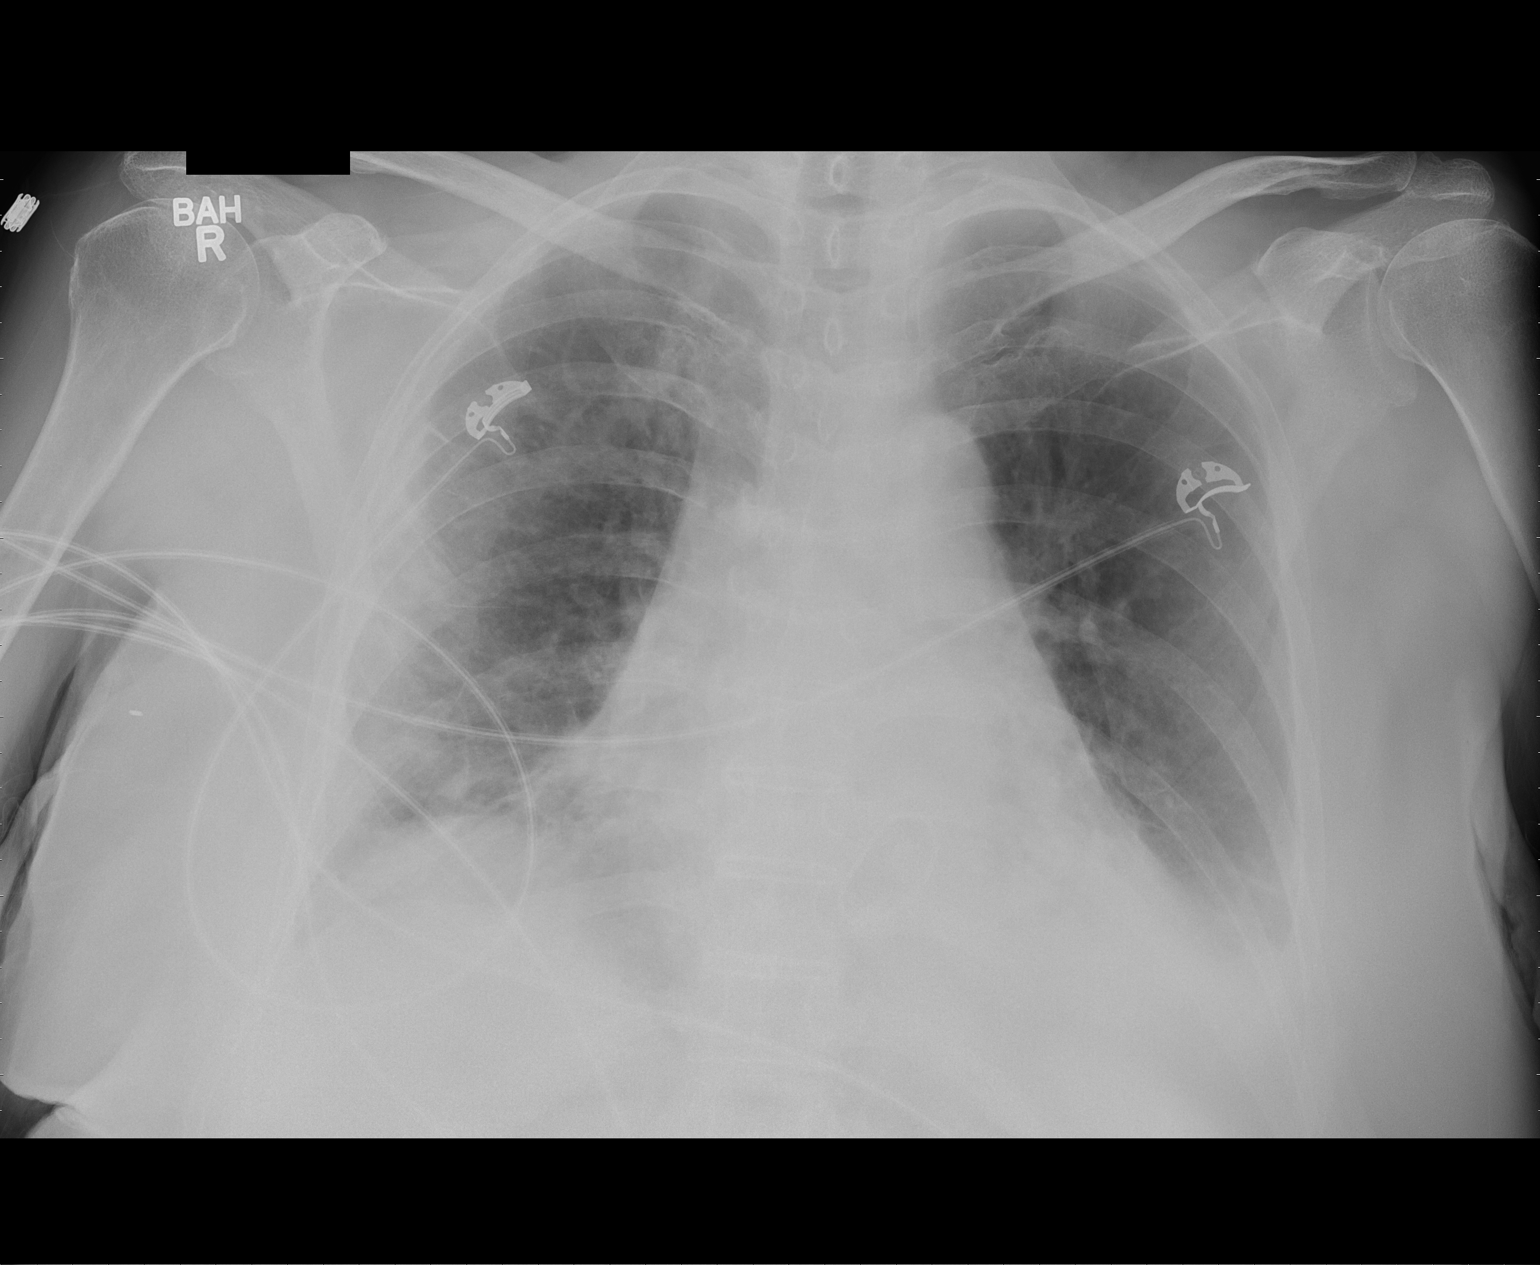

[1 of 1 positions shown; findings below may reference images not displayed]

FINDINGS: Right-sided chest tube has been removed.  Interval
development of small right apical pneumothorax approximately 5%.
Remainder of findings without change.
IMPRESSION: Right-sided chest tube has been removed.  Interval development of
small right apical pneumothorax approximately 5%.

Remainder of findings without change.

This is a call report.

## 2011-06-29 IMAGING — CR DG CHEST 2V
2 series · 2 of 2 positions shown · non-contrast
Comparison: 09/12/2010.

CLINICAL DATA: Status post mitral valve replacement.

CHEST - 2 VIEW

[w chest pa]
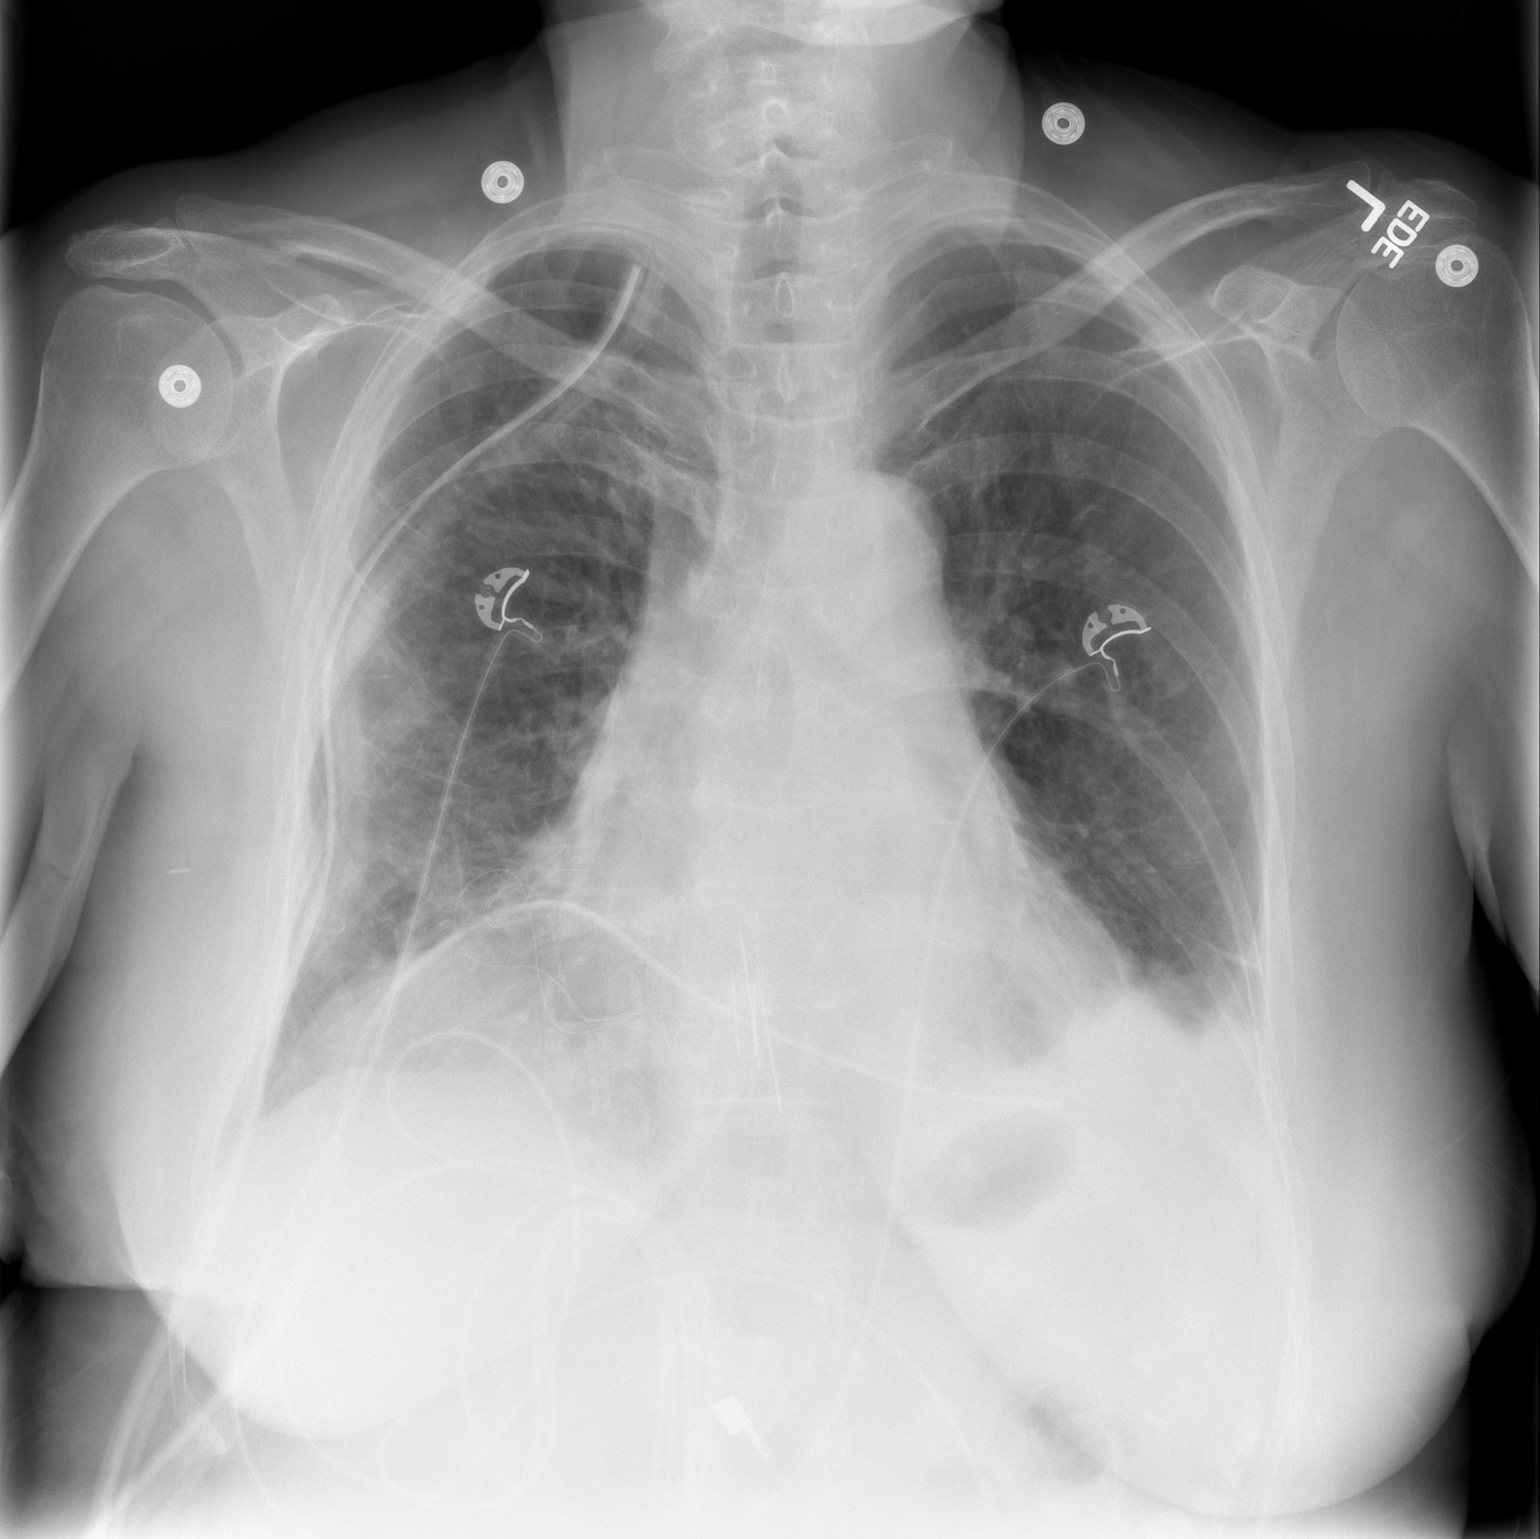

[w chest lat]
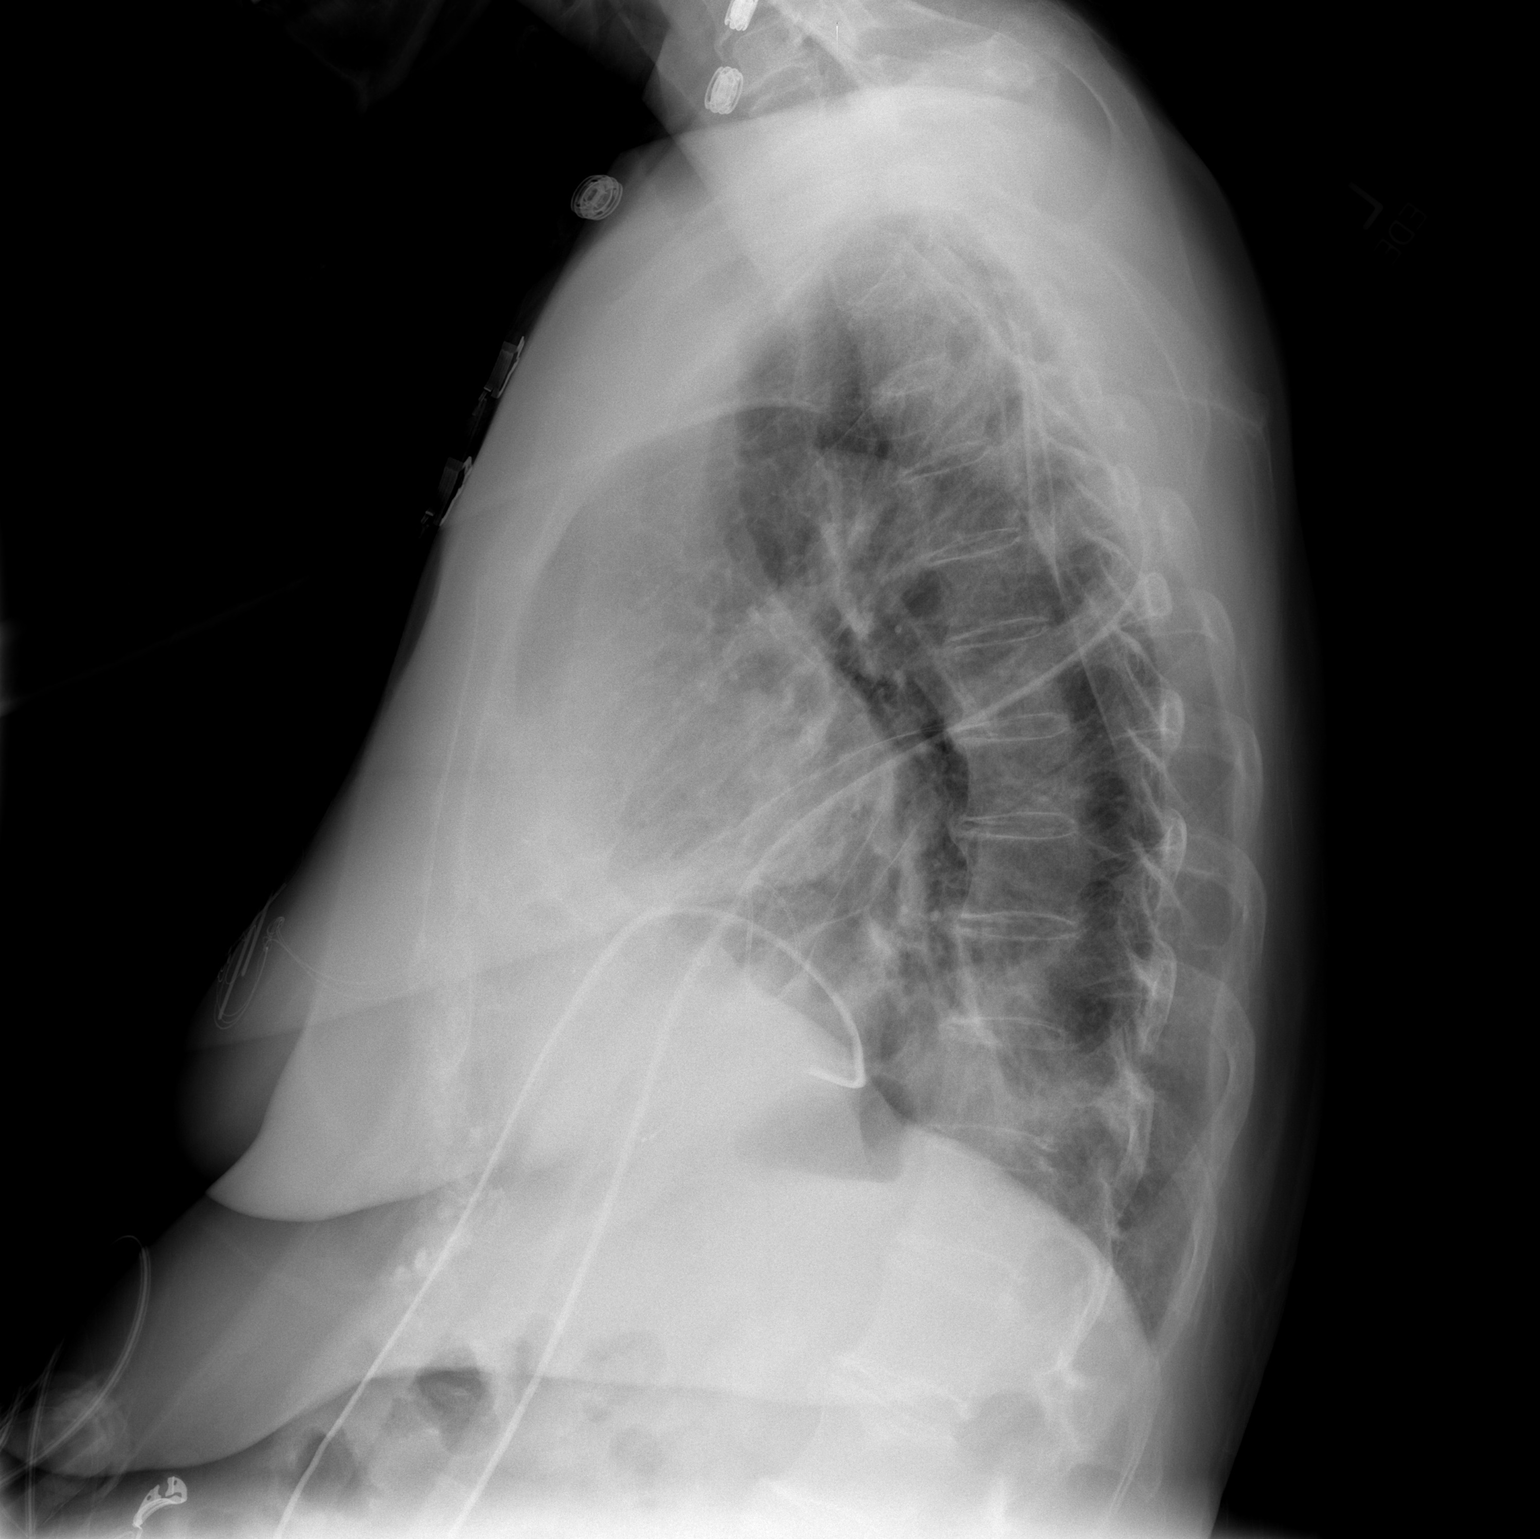

[2 of 2 positions shown; findings below may reference images not displayed]

FINDINGS: Right-sided chest tube is in place without evidence of
pneumothorax.  Status post mitral valve replacement.  Epicardial
leads in place.  Cardiomegaly.  Pulmonary vascular congestion/mild
congestive heart failure.  Small left-sided pleural effusion with
basilar atelectasis.  No segmental consolidation.
IMPRESSION: Cardiomegaly.  Status  post valve replacement.

Pulmonary vascular congestion/mild congestive heart failure.

Small left-sided pleural effusion and basilar atelectasis.

Right chest tube in place without pneumothorax.

## 2011-06-30 IMAGING — CR DG CHEST 2V
2 series · 2 of 2 positions shown · non-contrast
Comparison: Yesterday

CLINICAL DATA: Atrial fibrillation

CHEST - 2 VIEW

[w chest pa]
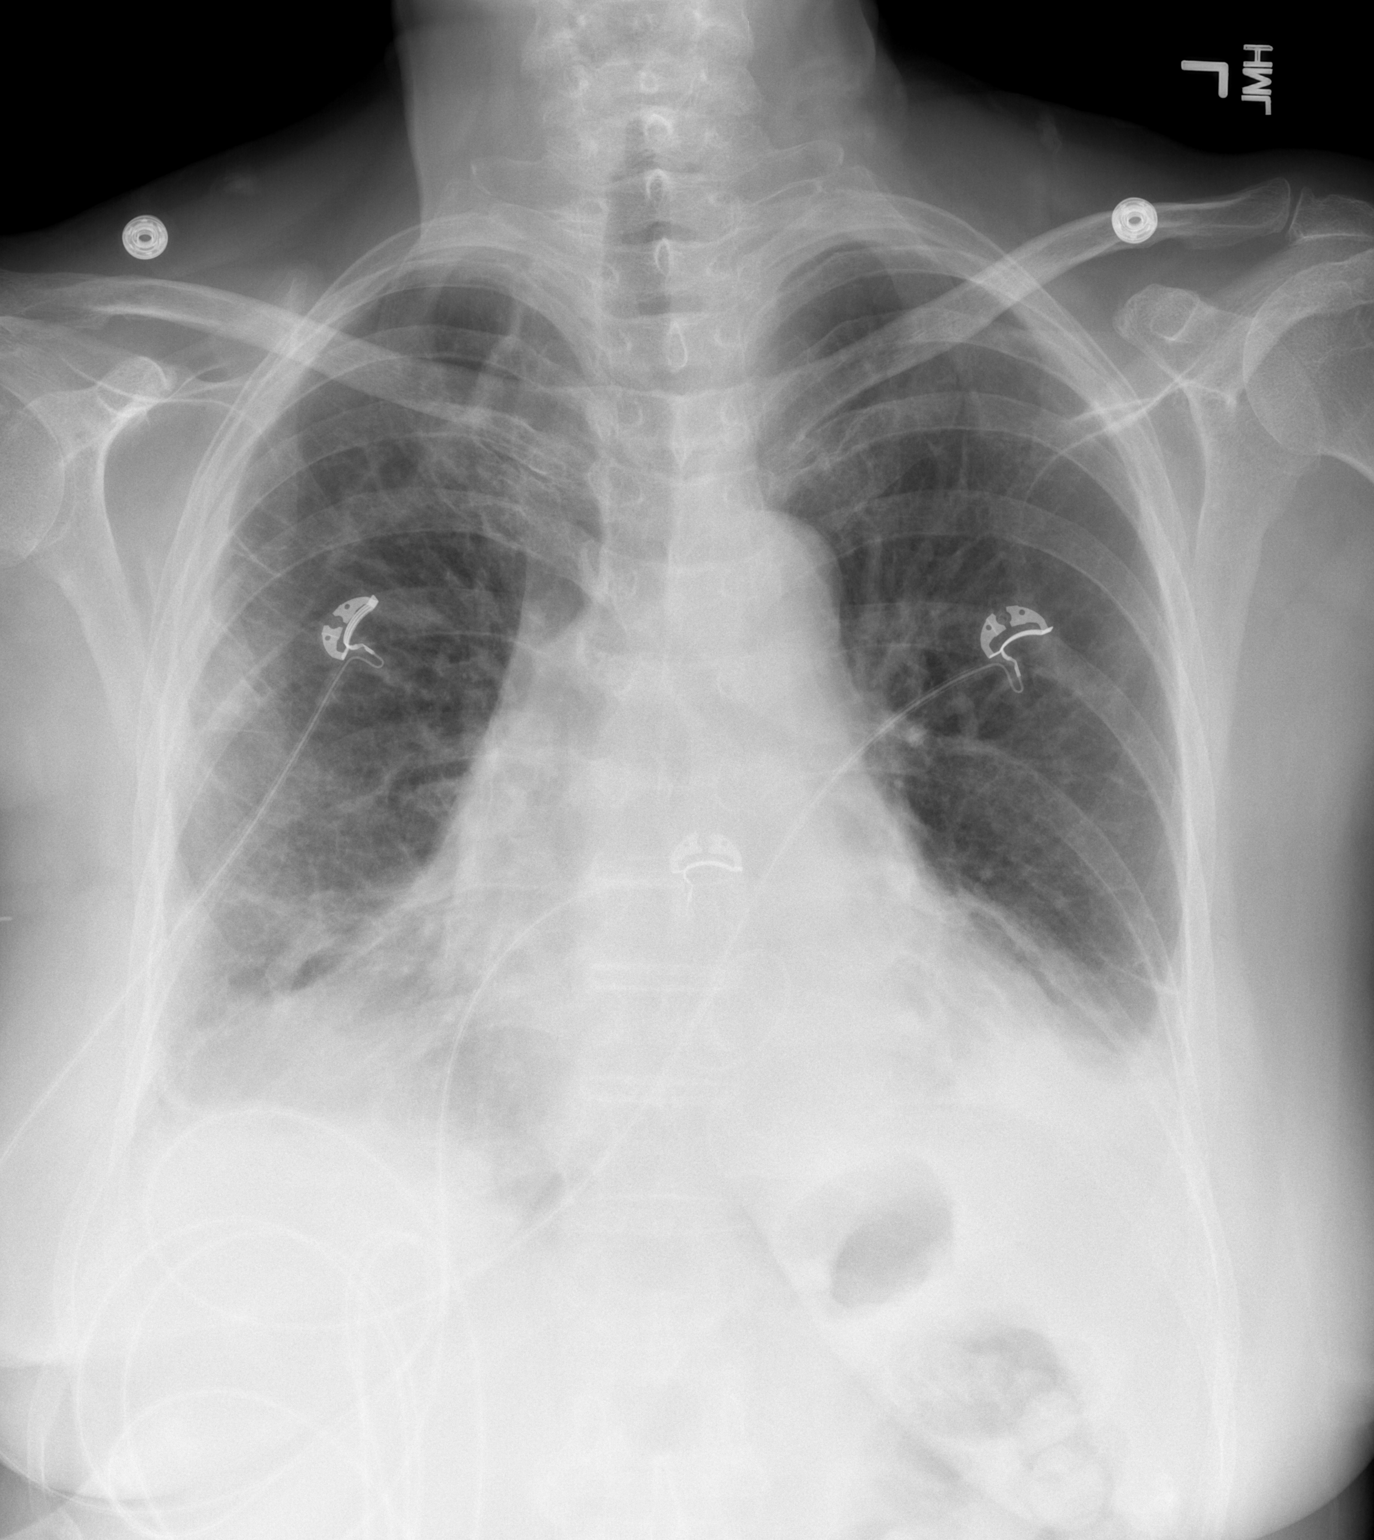

[w chest lat]
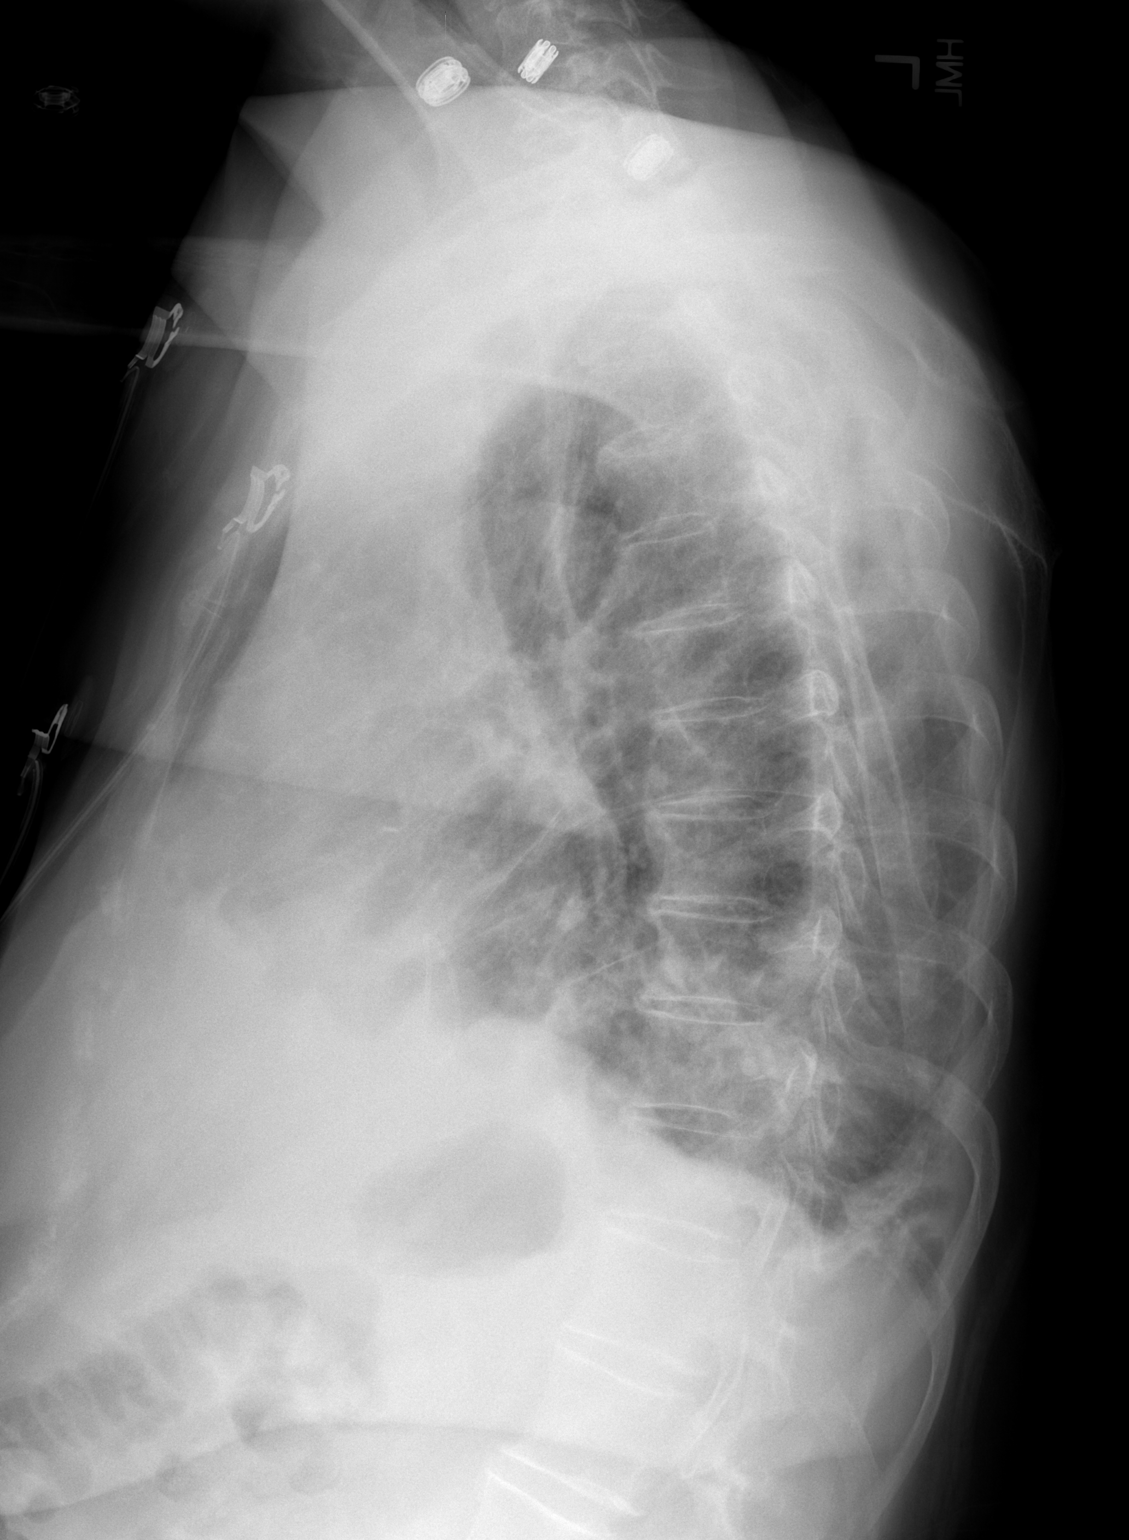

[2 of 2 positions shown; findings below may reference images not displayed]

FINDINGS: Tiny right apical pneumothorax is improved.  Bibasilar
pulmonary opacities are stable.  Stable cardiomegaly.  No left
pneumothorax.  Linear densities at the right upper lobe laterally
are unchanged.
IMPRESSION: Improved tiny apical right pneumothorax.

Stable bibasilar airspace disease.

## 2011-07-01 ENCOUNTER — Ambulatory Visit (INDEPENDENT_AMBULATORY_CARE_PROVIDER_SITE_OTHER): Payer: Medicare Other | Admitting: *Deleted

## 2011-07-01 DIAGNOSIS — I059 Rheumatic mitral valve disease, unspecified: Secondary | ICD-10-CM

## 2011-07-01 DIAGNOSIS — Z7901 Long term (current) use of anticoagulants: Secondary | ICD-10-CM

## 2011-07-01 DIAGNOSIS — I4891 Unspecified atrial fibrillation: Secondary | ICD-10-CM

## 2011-07-01 DIAGNOSIS — I4892 Unspecified atrial flutter: Secondary | ICD-10-CM

## 2011-07-10 ENCOUNTER — Telehealth: Payer: Self-pay

## 2011-07-10 MED ORDER — METOPROLOL TARTRATE 50 MG PO TABS: 50.0000 mg | ORAL_TABLET | Freq: Two times a day (BID) | ORAL | Status: DC

## 2011-07-10 MED ORDER — OMEPRAZOLE 40 MG PO CPDR: 40.0000 mg | DELAYED_RELEASE_CAPSULE | Freq: Every day | ORAL | Status: DC

## 2011-07-10 NOTE — Telephone Encounter (Signed)
.   Requested Prescriptions   Pending Prescriptions Disp Refills  . omeprazole (PRILOSEC) 40 MG capsule 90 capsule 4    Sig: Take 1 capsule (40 mg total) by mouth daily.   Pt was d/c from taking  Pepcid 10 mg on LOV: 03/14/11.

## 2011-07-10 NOTE — Telephone Encounter (Signed)
..   Requested Prescriptions   Signed Prescriptions Disp Refills  . metoprolol (LOPRESSOR) 50 MG tablet 90 tablet 4    Sig: Take 1 tablet (50 mg total) by mouth 2 (two) times daily.    Authorizing Provider: DE Karma Lew    Ordering User: Lacie Scotts

## 2011-07-29 ENCOUNTER — Ambulatory Visit (INDEPENDENT_AMBULATORY_CARE_PROVIDER_SITE_OTHER): Payer: Medicare Other | Admitting: *Deleted

## 2011-07-29 DIAGNOSIS — I059 Rheumatic mitral valve disease, unspecified: Secondary | ICD-10-CM

## 2011-07-29 DIAGNOSIS — Z7901 Long term (current) use of anticoagulants: Secondary | ICD-10-CM

## 2011-07-29 DIAGNOSIS — I4892 Unspecified atrial flutter: Secondary | ICD-10-CM

## 2011-07-29 DIAGNOSIS — I4891 Unspecified atrial fibrillation: Secondary | ICD-10-CM

## 2011-07-29 LAB — POCT INR: INR: 3.6

## 2011-08-12 ENCOUNTER — Ambulatory Visit (INDEPENDENT_AMBULATORY_CARE_PROVIDER_SITE_OTHER): Payer: Medicare Other | Admitting: *Deleted

## 2011-08-12 DIAGNOSIS — I4892 Unspecified atrial flutter: Secondary | ICD-10-CM

## 2011-08-12 DIAGNOSIS — Z7901 Long term (current) use of anticoagulants: Secondary | ICD-10-CM

## 2011-08-12 DIAGNOSIS — I4891 Unspecified atrial fibrillation: Secondary | ICD-10-CM

## 2011-08-12 DIAGNOSIS — I059 Rheumatic mitral valve disease, unspecified: Secondary | ICD-10-CM

## 2011-09-02 ENCOUNTER — Ambulatory Visit (INDEPENDENT_AMBULATORY_CARE_PROVIDER_SITE_OTHER): Payer: Medicare Other | Admitting: *Deleted

## 2011-09-02 DIAGNOSIS — Z7901 Long term (current) use of anticoagulants: Secondary | ICD-10-CM

## 2011-09-02 DIAGNOSIS — I4892 Unspecified atrial flutter: Secondary | ICD-10-CM

## 2011-09-02 DIAGNOSIS — I059 Rheumatic mitral valve disease, unspecified: Secondary | ICD-10-CM

## 2011-09-02 DIAGNOSIS — I4891 Unspecified atrial fibrillation: Secondary | ICD-10-CM

## 2011-09-09 ENCOUNTER — Encounter: Payer: Medicare Other | Admitting: *Deleted

## 2011-09-15 ENCOUNTER — Encounter: Payer: Self-pay | Admitting: Thoracic Surgery (Cardiothoracic Vascular Surgery)

## 2011-09-15 ENCOUNTER — Ambulatory Visit (INDEPENDENT_AMBULATORY_CARE_PROVIDER_SITE_OTHER): Payer: Medicare Other | Admitting: Thoracic Surgery (Cardiothoracic Vascular Surgery)

## 2011-09-15 VITALS — BP 130/82 | HR 60 | Resp 20 | Ht 69.0 in | Wt 178.0 lb

## 2011-09-15 DIAGNOSIS — I059 Rheumatic mitral valve disease, unspecified: Secondary | ICD-10-CM

## 2011-09-15 DIAGNOSIS — Z9889 Other specified postprocedural states: Secondary | ICD-10-CM

## 2011-09-15 DIAGNOSIS — I4891 Unspecified atrial fibrillation: Secondary | ICD-10-CM

## 2011-09-15 DIAGNOSIS — Z8679 Personal history of other diseases of the circulatory system: Secondary | ICD-10-CM | POA: Insufficient documentation

## 2011-09-15 DIAGNOSIS — I080 Rheumatic disorders of both mitral and aortic valves: Secondary | ICD-10-CM

## 2011-09-15 NOTE — Progress Notes (Signed)
PCP is No primary provider on file. Referring Provider is de Karma Lew, MD  Chief Complaint  Patient presents with  . Follow-up    6 month f/u with rhythum check, S/P MVR, Maze 08/31/2010    HPI:  Patient returns for routine followup and rhythm check now 1 year following mitral valve repair and Maze procedure. She is doing exceptionally well. She continues to remain active in the cardiac rehabilitation program. She reports excellent exercise tolerance. She does not get short of breath with activity. She has not had any palpitations. Overall she feels remarkably well.   Past Medical History  Diagnosis Date  . Arrhythmia     paroxysmal atrial fib normal sinus rhythm as off oct 2011  . Hypothyroidism   . Pneumonia   . Mitral regurgitation     aortic stenosis  . S/P mitral valve repair     post right mini thoractomy status post quadrangular resection and patch repair of posterrior leaflet artificial Gore-Tex cord replacement times 2 32 mm sore and ring annulplasty prior maze procedure left side lesion site.oct 2011  . Acquired inhibitor of coagulation     coumadin therapy    Past Surgical History  Procedure Date  . Arthroscopic left knee surgery   . Mitral valve repair 09/10/2010    complex valvuloplasty with #71mm ring annuloplasty via right mini thoracotomy  . Maze 09/10/2010    left side lesion set    History reviewed. No pertinent family history.  Social History History  Substance Use Topics  . Smoking status: Never Smoker   . Smokeless tobacco: Not on file  . Alcohol Use: No    Current Outpatient Prescriptions  Medication Sig Dispense Refill  . aspirin 81 MG tablet Take 81 mg by mouth daily.        . cetirizine (ZYRTEC) 10 MG tablet Take 10 mg by mouth daily as needed.       . fluticasone (FLONASE) 50 MCG/ACT nasal spray 2 sprays by Nasal route daily.        Marland Kitchen guaifenesin (HUMIBID E) 400 MG TABS Take 400 mg by mouth every 4 (four) hours as needed.       Marland Kitchen  levothyroxine (SYNTHROID, LEVOTHROID) 125 MCG tablet Take 125 mcg by mouth daily.        Marland Kitchen lisinopril (PRINIVIL,ZESTRIL) 5 MG tablet Take 5 mg by mouth daily.        . metoprolol (LOPRESSOR) 50 MG tablet Take 1 tablet (50 mg total) by mouth 2 (two) times daily.  90 tablet  4  . omeprazole (PRILOSEC) 40 MG capsule Take 1 capsule (40 mg total) by mouth daily.  90 capsule  4  . warfarin (COUMADIN) 5 MG tablet Increase coumadin to 10mg  daily  200 tablet  1    Allergies  Allergen Reactions  . Codeine     REACTION: palpitations,vomiting,sweating    Review of Systems  Constitutional: Negative.   HENT: Negative.   Eyes: Negative.   Respiratory: Negative.  Negative for chest tightness and shortness of breath.   Cardiovascular: Negative.  Negative for palpitations.  Gastrointestinal: Negative.   Genitourinary: Negative.   Musculoskeletal: Negative.   Neurological: Negative.   Hematological: Negative.   Psychiatric/Behavioral: Negative.     BP 130/82  Pulse 60  Resp 20  Ht 5\' 9"  (1.753 m)  Wt 178 lb (80.74 kg)  BMI 26.29 kg/m2  SpO2 97% Physical Exam  Vitals reviewed. Constitutional: She is oriented to person, place, and time. She appears  well-developed and well-nourished.  HENT:  Head: Normocephalic.  Eyes: Pupils are equal, round, and reactive to light.  Neck: Normal range of motion. Neck supple. No JVD present.  Cardiovascular: Normal rate, regular rhythm and normal heart sounds.   No murmur heard.      Heart sounds are clear. There is no murmur on exam. 2 channel telemetry rhythm strip demonstrates normal sinus rhythm.  Pulmonary/Chest: Effort normal and breath sounds normal. She has no rales. She exhibits no tenderness.  Abdominal: Soft. Bowel sounds are normal.  Musculoskeletal: Normal range of motion. She exhibits no edema.  Lymphadenopathy:    She has no cervical adenopathy.  Neurological: She is alert and oriented to person, place, and time.  Skin: Skin is warm and  dry.  Psychiatric: She has a normal mood and affect. Her behavior is normal. Judgment and thought content normal.    Impression:  Patient is doing very well 1 year following mitral valve repair and Maze procedure. Her exercise tolerance is quite good. She is not having any palpitations and she is maintaining sinus rhythm.   Plan:  I think it would be reasonable to consider stopping Coumadin, or at least check in an outpatient Holter monitor to make sure that she is not having subclinical irregular heart rhythms. We will see the patient back in one years time for routine followup and rhythm check.

## 2011-09-30 ENCOUNTER — Ambulatory Visit (INDEPENDENT_AMBULATORY_CARE_PROVIDER_SITE_OTHER): Payer: Medicare Other | Admitting: *Deleted

## 2011-09-30 DIAGNOSIS — I4892 Unspecified atrial flutter: Secondary | ICD-10-CM

## 2011-09-30 DIAGNOSIS — Z7901 Long term (current) use of anticoagulants: Secondary | ICD-10-CM

## 2011-09-30 DIAGNOSIS — I059 Rheumatic mitral valve disease, unspecified: Secondary | ICD-10-CM

## 2011-09-30 DIAGNOSIS — I4891 Unspecified atrial fibrillation: Secondary | ICD-10-CM

## 2011-10-05 NOTE — Progress Notes (Signed)
Apply 48 hour Holter monitor. If negative, ie no recurrent afib  patient can d/c coumadin. Patient last seen 4/12 - so also should have appointment soon after Holter.

## 2011-10-07 ENCOUNTER — Encounter (INDEPENDENT_AMBULATORY_CARE_PROVIDER_SITE_OTHER): Payer: Medicare Other | Admitting: *Deleted

## 2011-10-07 ENCOUNTER — Telehealth: Payer: Self-pay | Admitting: *Deleted

## 2011-10-07 DIAGNOSIS — I4891 Unspecified atrial fibrillation: Secondary | ICD-10-CM

## 2011-10-07 NOTE — Telephone Encounter (Signed)
Patient in office today for monitor.  Notified of below.

## 2011-10-07 NOTE — Progress Notes (Unsigned)
Patient in office for 48 hour holter monitor.  Applied & instructions given.  Informed patient per Dr. [name] - if monitor negative (no recurrent atrial fib), she can d/c her coumadin.  She verbalized understanding.  Will notify her when results available.  Office visit & coumadin check scheduled for 11/27.

## 2011-10-07 NOTE — Telephone Encounter (Signed)
Message copied by Murriel Hopper on Tue Oct 07, 2011 11:34 AM ------      Message from: Claudette Laws R      Created: Mon Oct 06, 2011  8:52 AM      Regarding: appt schedule but didnt  notify about ccr       Peyton Bottoms, MD  10/05/2011  4:17 AM  Signed      Apply 48 hour Holter monitor. If negative, ie no recurrent afib  patient can d/c coumadin. Patient last seen 4/12 - so also should have appointment soon after Holter.                 Electronic signature on 09/15/2011                    Scheduled holter and left message for patient to call.

## 2011-10-08 ENCOUNTER — Encounter: Payer: Medicare Other | Admitting: *Deleted

## 2011-10-24 ENCOUNTER — Telehealth: Payer: Self-pay | Admitting: *Deleted

## 2011-10-24 ENCOUNTER — Other Ambulatory Visit: Payer: Self-pay | Admitting: *Deleted

## 2011-10-24 NOTE — Telephone Encounter (Signed)
Per Dr. [name] review of Holter monitor - NSR, rare PVC's.    Notified patient of above & to d/c coumadin as previously discussed.  Has follow up already scheduled for 11/27 with GD.

## 2011-10-28 ENCOUNTER — Encounter: Payer: Self-pay | Admitting: Cardiology

## 2011-10-28 ENCOUNTER — Ambulatory Visit (INDEPENDENT_AMBULATORY_CARE_PROVIDER_SITE_OTHER): Payer: Medicare Other | Admitting: Cardiology

## 2011-10-28 ENCOUNTER — Encounter: Payer: Medicare Other | Admitting: *Deleted

## 2011-10-28 VITALS — BP 115/80 | HR 68 | Ht 68.0 in | Wt 178.0 lb

## 2011-10-28 DIAGNOSIS — I059 Rheumatic mitral valve disease, unspecified: Secondary | ICD-10-CM

## 2011-10-28 DIAGNOSIS — Z7901 Long term (current) use of anticoagulants: Secondary | ICD-10-CM

## 2011-10-28 DIAGNOSIS — Z9889 Other specified postprocedural states: Secondary | ICD-10-CM

## 2011-10-28 NOTE — Patient Instructions (Signed)
Continue all current medications. Your physician wants you to follow up in:  3 months.  You will receive a reminder letter in the mail one-two months in advance.  If you don't receive a letter, please call our office to schedule the follow up appointment   

## 2011-10-28 NOTE — Assessment & Plan Note (Signed)
No recurrent atrial fibrillation. Maintaining normal sinus rhythm

## 2011-10-28 NOTE — Progress Notes (Signed)
CC: Followup post mitral valve repair and left-sided Cox-Maze procedure  HPI:  Patient is status post mitral valve repair and maze procedure. She had a Holter monitor done which confirmed the presence of normal sinus rhythm. Coumadin was discontinued. She is doing very well from a cardiac standpoint is hemodynamically stable. Unfortunately she is going through a lot of stress currently because her husband has been admitted to [facility] with sepsis and heart failure and is on mechanical ventilation. She denies however any chest pain shortness of breath orthopnea PND palpitations or syncope.     PMH: reviewed and listed in Problem List in Electronic Records (and see below) Past Medical History  Diagnosis Date  . Atrial fibrillation     Status post Cox-Maze procedure/left-sided maze procedure at the time of mitral valve repair  . Hypothyroidism   . Pneumonia   . Mitral regurgitation     Status post mitral valve repair  . S/P mitral valve repair     post right mini thoractomy status post quadrangular resection and patch repair of posterrior leaflet artificial Gore-Tex cord replacement times 2 32 mm sore and ring annulplasty prior maze procedure left side lesion site.oct 2011  . Acquired inhibitor of coagulation     coumadin therapy discontinued after Cox-Maze procedure and confirmation of normal sinus rhythm at 48 hour Holter monitor.      Allergies/SH/FHX : available in Electronic Records for review  Medications: Current Outpatient Prescriptions  Medication Sig Dispense Refill  . aspirin 81 MG tablet Take 81 mg by mouth daily.        . cetirizine (ZYRTEC) 10 MG tablet Take 10 mg by mouth daily as needed.       . fluticasone (FLONASE) 50 MCG/ACT nasal spray 2 sprays by Nasal route daily.        Marland Kitchen guaifenesin (HUMIBID E) 400 MG TABS Take 400 mg by mouth every 4 (four) hours as needed.       Marland Kitchen levothyroxine (SYNTHROID, LEVOTHROID) 125 MCG tablet Take 125 mcg by mouth daily.         Marland Kitchen lisinopril (PRINIVIL,ZESTRIL) 5 MG tablet Take 5 mg by mouth daily.        . metoprolol (LOPRESSOR) 50 MG tablet Take 1 tablet (50 mg total) by mouth 2 (two) times daily.  90 tablet  4  . omeprazole (PRILOSEC) 40 MG capsule Take 1 capsule (40 mg total) by mouth daily.  90 capsule  4    ROS: No nausea or vomiting. No fever or chills.No melena or hematochezia.No bleeding.No claudication  Physical Exam: BP 115/80  Pulse 68  Ht 5\' 8"  (1.727 m)  Wt 178 lb (80.74 kg)  BMI 27.06 kg/m2 General: Well-nourished white female in no apparent distress. Neck: Normal carotid upstroke no carotid bruits. No thyromegaly. Lung nodule of thyroid Lungs: Clear breath sounds bilaterally. No wheezing. Cardiac: Regular rate and rhythm with normal S1-S2 and no murmur rubs or gallops Vascular: No edema. Normal dorsalis pedis and posterior to her pulses Skin: Warm and dry  12lead ECG: Not obtained Limited bedside ECHO:N/A   Assessment and Plan

## 2011-10-28 NOTE — Assessment & Plan Note (Signed)
Stable post surgery. No significant murmur on exam. Functional class NYHA class I.

## 2011-10-28 NOTE — Assessment & Plan Note (Signed)
Coumadin discontinued. Status post Cox-Maze procedure and negative followup Holter monitor for atrial fibrillation

## 2011-11-04 ENCOUNTER — Ambulatory Visit: Payer: Self-pay | Admitting: *Deleted

## 2011-11-04 DIAGNOSIS — I4892 Unspecified atrial flutter: Secondary | ICD-10-CM

## 2011-11-04 DIAGNOSIS — I4891 Unspecified atrial fibrillation: Secondary | ICD-10-CM

## 2011-11-04 DIAGNOSIS — I059 Rheumatic mitral valve disease, unspecified: Secondary | ICD-10-CM

## 2011-11-04 DIAGNOSIS — Z7901 Long term (current) use of anticoagulants: Secondary | ICD-10-CM

## 2012-01-13 ENCOUNTER — Other Ambulatory Visit: Payer: Self-pay | Admitting: *Deleted

## 2012-01-13 MED ORDER — LISINOPRIL 5 MG PO TABS: 5.0000 mg | ORAL_TABLET | Freq: Every day | ORAL | Status: DC

## 2012-01-13 MED ORDER — METOPROLOL TARTRATE 50 MG PO TABS: 50.0000 mg | ORAL_TABLET | Freq: Two times a day (BID) | ORAL | Status: DC

## 2012-02-09 ENCOUNTER — Other Ambulatory Visit: Payer: Self-pay | Admitting: *Deleted

## 2012-02-09 NOTE — Telephone Encounter (Signed)
Patient called requesting prescription for clonazepam since her husband isn't doing well and she need something to help her nerves. Nurse advised patient that she should call her family doctor to handle this prescription since it wasn't cardiac related and it would be handled quicker. Patient stated that she specifically wanted [facility] to handle this prescription and she would wait for it. Nurse advised patient that it would be routed to MD desktop but may not be responded to right away. Patient said she would wait for it.

## 2012-02-10 NOTE — Telephone Encounter (Signed)
Patient can have one time script of clonazepam given difficult circumstances and stress involved with husband's heath. Clonazepam 0.25 mg PO BID x 30 days. No refills.

## 2012-02-11 MED ORDER — CLONAZEPAM 0.25 MG PO TBDP: 0.2500 mg | ORAL_TABLET | Freq: Two times a day (BID) | ORAL | Status: DC | PRN

## 2012-02-11 NOTE — Telephone Encounter (Signed)
Patient informed and prescription called to [facility] [address]. Royston Bake.

## 2012-04-13 ENCOUNTER — Ambulatory Visit (INDEPENDENT_AMBULATORY_CARE_PROVIDER_SITE_OTHER): Payer: Medicare Other | Admitting: Cardiology

## 2012-04-13 VITALS — BP 144/69 | HR 76 | Resp 16 | Wt 172.0 lb

## 2012-04-13 DIAGNOSIS — I4891 Unspecified atrial fibrillation: Secondary | ICD-10-CM

## 2012-04-13 DIAGNOSIS — G4700 Insomnia, unspecified: Secondary | ICD-10-CM | POA: Insufficient documentation

## 2012-04-13 DIAGNOSIS — Z9889 Other specified postprocedural states: Secondary | ICD-10-CM

## 2012-04-13 DIAGNOSIS — I059 Rheumatic mitral valve disease, unspecified: Secondary | ICD-10-CM

## 2012-04-13 DIAGNOSIS — I080 Rheumatic disorders of both mitral and aortic valves: Secondary | ICD-10-CM

## 2012-04-13 DIAGNOSIS — I359 Nonrheumatic aortic valve disorder, unspecified: Secondary | ICD-10-CM

## 2012-04-13 DIAGNOSIS — Z7901 Long term (current) use of anticoagulants: Secondary | ICD-10-CM

## 2012-04-13 MED ORDER — LISINOPRIL 5 MG PO TABS: 2.5000 mg | ORAL_TABLET | Freq: Every day | ORAL | Status: DC

## 2012-04-13 MED ORDER — METOPROLOL TARTRATE 50 MG PO TABS: 50.0000 mg | ORAL_TABLET | ORAL | Status: DC

## 2012-04-13 MED ORDER — TRAZODONE HCL 50 MG PO TABS: 50.0000 mg | ORAL_TABLET | Freq: Every day | ORAL | Status: DC

## 2012-04-13 MED ORDER — LEVOTHYROXINE SODIUM 125 MCG PO TABS: 125.0000 ug | ORAL_TABLET | Freq: Every day | ORAL | Status: AC

## 2012-04-13 NOTE — Assessment & Plan Note (Signed)
Patient remains in normal sinus rhythm. 

## 2012-04-13 NOTE — Assessment & Plan Note (Signed)
Discontinued

## 2012-04-13 NOTE — Assessment & Plan Note (Signed)
A provided the patient with a prescription of trazodone 50 mm by mouth each bedtime and asked her to minimize the use of clonazepam is much as possible.

## 2012-04-13 NOTE — Assessment & Plan Note (Signed)
Resolved

## 2012-04-13 NOTE — Progress Notes (Signed)
Tracy Bottoms, MD, [facility] ABIM Board Certified in Adult Cardiovascular Medicine,Internal Medicine and Critical Care Medicine    CC: Followup patient with history of mitral valve repair and Cox-Maze procedure.  HPI:  The appointment today was actually for her husband recently died. The patient however wanted to be seen. The patient is depressed and has been going to crying spells. She reports some atypical chest pain. She reports no shortness of breath orthopnea PND. She has no palpitations or syncope. She does report insomnia and ruminating thoughts. As all started after her husband died recently from severe sepsis and multisystem organ failure. From a cardiac standpoint the patient is otherwise doing quite well.  PMH: reviewed and listed in Problem List in Electronic Records (and see below) Past Medical History  Diagnosis Date  . Atrial fibrillation     Status post Cox-Maze procedure/left-sided maze procedure at the time of mitral valve repair  . Hypothyroidism   . Pneumonia   . Mitral regurgitation     Status post mitral valve repair  . S/P mitral valve repair     post right mini thoractomy status post quadrangular resection and patch repair of posterrior leaflet artificial Gore-Tex cord replacement times 2 32 mm sore and ring annulplasty prior maze procedure left side lesion site.oct 2011  . Acquired inhibitor of coagulation     coumadin therapy   Past Surgical History  Procedure Date  . Arthroscopic left knee surgery   . Mitral valve repair 09/10/2010    complex valvuloplasty with #106mm ring annuloplasty via right mini thoracotomy  . Maze 09/10/2010    left side lesion set    Allergies/SH/FHX : available in Electronic Records for review  Allergies  Allergen Reactions  . Codeine     REACTION: palpitations,vomiting,sweating   History   Social History  . Marital Status: Married    Spouse Name: N/A    Number of Children: N/A  . Years of Education: N/A   Occupational  History  .      lives with husband locally   Social History Main Topics  . Smoking status: Never Smoker   . Smokeless tobacco: Never Used  . Alcohol Use: No  . Drug Use: No  . Sexually Active: Not on file   Other Topics Concern  . Not on file   Social History Narrative  . No narrative on file   No family history on file.  Medications: Current Outpatient Prescriptions  Medication Sig Dispense Refill  . aspirin 81 MG tablet Take 81 mg by mouth daily.        . cetirizine (ZYRTEC) 10 MG tablet Take 10 mg by mouth daily as needed.       . clonazePAM (KLONOPIN) 0.25 MG disintegrating tablet Take 1 tablet (0.25 mg total) by mouth 2 (two) times daily as needed.  30 tablet  0  . guaifenesin (HUMIBID E) 400 MG TABS Take 400 mg by mouth every 4 (four) hours as needed.       Marland Kitchen levothyroxine (SYNTHROID, LEVOTHROID) 125 MCG tablet Take 1 tablet (125 mcg total) by mouth daily.  90 tablet  0  . lisinopril (PRINIVIL,ZESTRIL) 5 MG tablet Take 0.5 tablets (2.5 mg total) by mouth daily.  45 tablet  3  . metoprolol (LOPRESSOR) 50 MG tablet Take 1 tablet (50 mg total) by mouth as directed. 1 qam and 1/2 qhs  135 tablet  3  . omeprazole (PRILOSEC) 40 MG capsule Take 40 mg by mouth as needed.      Marland Kitchen  DISCONTD: levothyroxine (SYNTHROID, LEVOTHROID) 125 MCG tablet Take 125 mcg by mouth daily.        Marland Kitchen DISCONTD: lisinopril (PRINIVIL,ZESTRIL) 5 MG tablet Take 1 tablet (5 mg total) by mouth daily.  30 tablet  6  . DISCONTD: lisinopril (PRINIVIL,ZESTRIL) 5 MG tablet Take 2.5 mg by mouth daily.      Marland Kitchen DISCONTD: metoprolol (LOPRESSOR) 50 MG tablet Take 1 tablet (50 mg total) by mouth 2 (two) times daily.  60 tablet  6  . DISCONTD: metoprolol (LOPRESSOR) 50 MG tablet Take 50 mg by mouth as directed. 1 qam and 1/2 qhs      . DISCONTD: omeprazole (PRILOSEC) 40 MG capsule Take 1 capsule (40 mg total) by mouth daily.  90 capsule  4  . traZODone (DESYREL) 50 MG tablet Take 1 tablet (50 mg total) by mouth at bedtime.   90 tablet  2    ROS: No nausea or vomiting. No fever or chills.No melena or hematochezia.No bleeding.No claudication  Physical Exam: BP 144/69  Pulse 76  Resp 16  Wt 172 lb (78.019 kg) General: Well-nourished white female in no distress, but tearful Neck: Normal carotid upstroke no carotid bruits. No thyromegaly nonnodular thyroid Lungs: Clear breath sounds bilaterally without wheezing. Cardiac: Regular rate and rhythm with normal S1-S2 no murmur rubs or gallops Vascular: No edema. Normal distal pulses bilaterally Skin: Warm and dry Physcologic: Normal affect  12lead ECG: Not obtained Limited bedside ECHO:N/A No images are attached to the encounter.   Assessment and Plan  S/P MVR (mitral valve repair) Normal exam.  S/P Maze operation for atrial fibrillation Patient remains in normal sinus rhythm.  Mitral valve disorders No postsurgical complications. The patient is doing extremely well from a cardiovascular perspective.  MITRAL REGURGITATION Resolved.  Encounter for long-term (current) use of anticoagulants Discontinued  Atrial fibrillation Patient remains in normal sinus rhythm and she is not on antiarrhythmic therapy  AORTIC STENOSIS No significant murmur on exam. We will follow clinically for now  Insomnia A provided the patient with a prescription of trazodone 50 mm by mouth each bedtime and asked her to minimize the use of clonazepam is much as possible.    Patient Active Problem List  Diagnoses  . MITRAL REGURGITATION  . AORTIC STENOSIS  . Atrial fibrillation  . HYPERTENSION, BENIGN ESSENTIAL  . Mitral valve disorders  . Encounter for long-term (current) use of anticoagulants  . S/P MVR (mitral valve repair)  . S/P Maze operation for atrial fibrillation  . Insomnia

## 2012-04-13 NOTE — Assessment & Plan Note (Signed)
Normal exam.

## 2012-04-13 NOTE — Assessment & Plan Note (Signed)
No postsurgical complications. The patient is doing extremely well from a cardiovascular perspective.

## 2012-04-13 NOTE — Assessment & Plan Note (Signed)
No significant murmur on exam. We will follow clinically for now

## 2012-04-13 NOTE — Assessment & Plan Note (Signed)
Patient remains in normal sinus rhythm and she is not on antiarrhythmic therapy

## 2012-04-13 NOTE — Patient Instructions (Signed)
   Try to minimize use of Clonazepam & slowly wean off medication  Trazodone 50mg  every evening for sleep Your physician wants you to follow up in: 6 months.  You will receive a reminder letter in the mail one-two months in advance.  If you don't receive a letter, please call our office to schedule the follow up appointment

## 2012-09-06 ENCOUNTER — Ambulatory Visit (INDEPENDENT_AMBULATORY_CARE_PROVIDER_SITE_OTHER): Payer: Medicare Other | Admitting: Thoracic Surgery (Cardiothoracic Vascular Surgery)

## 2012-09-06 ENCOUNTER — Encounter: Payer: Self-pay | Admitting: Thoracic Surgery (Cardiothoracic Vascular Surgery)

## 2012-09-06 VITALS — BP 163/93 | HR 60 | Resp 18 | Ht 69.0 in | Wt 173.0 lb

## 2012-09-06 DIAGNOSIS — I4891 Unspecified atrial fibrillation: Secondary | ICD-10-CM

## 2012-09-06 DIAGNOSIS — I059 Rheumatic mitral valve disease, unspecified: Secondary | ICD-10-CM

## 2012-09-06 DIAGNOSIS — Z9889 Other specified postprocedural states: Secondary | ICD-10-CM

## 2012-09-06 DIAGNOSIS — I080 Rheumatic disorders of both mitral and aortic valves: Secondary | ICD-10-CM

## 2012-09-06 DIAGNOSIS — Z8679 Personal history of other diseases of the circulatory system: Secondary | ICD-10-CM

## 2012-09-06 NOTE — Progress Notes (Signed)
                   [address]            [place] [address]          [phone]     CARDIOTHORACIC SURGERY OFFICE NOTE  Referring Provider is de Melanie Crazier, MD PCP is Aniceto Boss., MD   HPI:  Patient returns for followup 2 years status post mitral valve repair and Maze procedure. She was last seen here in the office 09/15/2011. Since then she has continued to do exceptionally well. She reports normal exercise tolerance and no shortness of breath. She's not having palpitations and she has not had any known recurrent arrhythmias. Unfortunately, over the past year her husband passed away due to cancer.   Current Outpatient Prescriptions  Medication Sig Dispense Refill  . aspirin 81 MG tablet Take 81 mg by mouth daily.        . cetirizine (ZYRTEC) 10 MG tablet Take 10 mg by mouth daily as needed.       . clonazePAM (KLONOPIN) 0.25 MG disintegrating tablet Take 1 tablet (0.25 mg total) by mouth 2 (two) times daily as needed.  30 tablet  0  . guaifenesin (HUMIBID E) 400 MG TABS Take 400 mg by mouth every 4 (four) hours as needed.       Marland Kitchen levothyroxine (SYNTHROID, LEVOTHROID) 125 MCG tablet Take 1 tablet (125 mcg total) by mouth daily.  90 tablet  0  . lisinopril (PRINIVIL,ZESTRIL) 5 MG tablet Take 0.5 tablets (2.5 mg total) by mouth daily.  45 tablet  3  . metoprolol (LOPRESSOR) 50 MG tablet Take 1 tablet (50 mg total) by mouth as directed. 1 qam and 1/2 qhs  135 tablet  3  . omeprazole (PRILOSEC) 40 MG capsule Take 40 mg by mouth as needed.          Physical Exam:   BP 163/93  Pulse 60  Resp 18  Ht 5\' 9"  (1.753 m)  Wt 173 lb (78.472 kg)  BMI 25.55 kg/m2  SpO2 97%  General:  Well-appearing  Chest:   Clear to auscultation with symmetrical breath sounds  CV:   Regular rate and rhythm without murmur  Incisions:  n/a  Abdomen:  Soft and nontender  Extremities:  Warm and well-perfused with no lower extremity edema  Diagnostic Tests:  2 channel telemetry  rhythm strip demonstrates normal sinus rhythm   Impression:  Patient is doing exceptionally well 2 years status post minimally invasive mitral valve repair and Maze procedure. She is maintaining sinus rhythm and overall doing well.  Plan:  The future the patient will call and return to see [place] only as needed.   Salvatore [facility]. Cornelius Moras, MD 09/06/2012 4:08 PM

## 2012-09-13 ENCOUNTER — Ambulatory Visit: Payer: Medicare Other | Admitting: Thoracic Surgery (Cardiothoracic Vascular Surgery)

## 2013-06-18 ENCOUNTER — Other Ambulatory Visit: Payer: Self-pay | Admitting: Cardiology

## 2013-08-31 ENCOUNTER — Encounter: Payer: Self-pay | Admitting: Cardiology

## 2013-08-31 ENCOUNTER — Ambulatory Visit (INDEPENDENT_AMBULATORY_CARE_PROVIDER_SITE_OTHER): Payer: Medicare Other | Admitting: Cardiology

## 2013-08-31 VITALS — BP 132/79 | HR 71 | Ht 68.5 in | Wt 172.0 lb

## 2013-08-31 DIAGNOSIS — I10 Essential (primary) hypertension: Secondary | ICD-10-CM

## 2013-08-31 DIAGNOSIS — I4891 Unspecified atrial fibrillation: Secondary | ICD-10-CM

## 2013-08-31 DIAGNOSIS — I080 Rheumatic disorders of both mitral and aortic valves: Secondary | ICD-10-CM

## 2013-08-31 NOTE — Assessment & Plan Note (Signed)
Reasonable blood pressure today. No changes made. 

## 2013-08-31 NOTE — Assessment & Plan Note (Signed)
Status post mitral valve repair with annuloplasty, doing very well symptomatically. Echocardiogram from 2012 reviewed, only trace mitral regurgitation at that time.  Encouraged regular exercise in symptom observation. Annual followup arranged.

## 2013-08-31 NOTE — Patient Instructions (Addendum)
Continue all current medications. Your physician wants you to follow up in:  1 year.  You will receive a reminder letter in the mail one-two months in advance.  If you don't receive a letter, please call our office to schedule the follow up appointment   

## 2013-08-31 NOTE — Assessment & Plan Note (Addendum)
Status post Cox-Maze procedure with mitral valve repair. She continues on aspirin and Toprol-XL. ECG shows normal sinus rhythm today.

## 2013-08-31 NOTE — Progress Notes (Signed)
    Clinical Summary Ms. Suell is a 67 y.o.female presenting for office visit. She is a former patient of Dr. [name], last seen in May 2013 in this office. She states that she has been doing very well, exercises 4 days a week, at least an hour at a time at a local gym. She reports no angina or limiting breathlessness. Has some orthopedic sounding soreness in her right shoulder area.  Recent lab work shows TSH 0.8, AST 27, ALT 27, cholesterol 185, HDL 49, triglycerides 158, LDL 105, potassium 4.3, BUN 16, creatinine 1.1, hemoglobin 14.6.  Echocardiogram from February 2012 revealed LVEF 60-65% with moderate LVH, trace mitral regurgitation, mean mitral gradient 7 mm mercury status post annular repair, mild tricuspid regurgitation.  ECG today shows normal sinus rhythm.  Allergies  Allergen Reactions  . Codeine     REACTION: palpitations,vomiting,sweating    Current Outpatient Prescriptions  Medication Sig Dispense Refill  . aspirin EC 81 MG tablet Take 81 mg by mouth every other day.      . levothyroxine (SYNTHROID, LEVOTHROID) 125 MCG tablet Take 1 tablet (125 mcg total) by mouth daily.  90 tablet  0  . metoprolol succinate (TOPROL-XL) 50 MG 24 hr tablet Take 50 mg by mouth daily. Take with or immediately following a meal.       No current facility-administered medications for this visit.    Past Medical History  Diagnosis Date  . Atrial fibrillation     Status post Cox-Maze procedure at the time of mitral valve repair  . Hypothyroidism   . History of pneumonia   . Mitral regurgitation     Status post mitral valve repair    Past Surgical History  Procedure Laterality Date  . Arthroscopic left knee surgery    . Mitral valve repair  09/10/2010    Complex valvuloplasty with #53mm ring annuloplasty via right mini thoracotomy  . Cox-maze procedure  09/10/2010    Social History Ms. Plair reports that she has never smoked. She has never used smokeless tobacco. Ms. Evon  reports that she does not drink alcohol.  Review of Systems No palpitations, no bleeding problems. No orthopnea or PND. Otherwise negative.  Physical Examination Filed Vitals:   08/31/13 1510  BP: 132/79  Pulse: 71   Filed Weights   08/31/13 1510  Weight: 172 lb (78.019 kg)   Tall woman, appears comfortable at rest. HEENT: Conjunctiva and lids normal, oropharynx clear. Neck: Supple, no elevated JVP or carotid bruits, no thyromegaly. Lungs: Clear to auscultation, nonlabored breathing at rest. Cardiac: Regular rate and rhythm, no S3 or significant systolic murmur, no pericardial rub. Abdomen: Soft, nontender, bowel sounds present. Extremities: No pitting edema, distal pulses 2+. Skin: Warm and dry. Musculoskeletal: No kyphosis. Neuropsychiatric: Alert and oriented x3, affect grossly appropriate.   Problem List and Plan   MITRAL REGURGITATION Status post mitral valve repair with annuloplasty, doing very well symptomatically. Echocardiogram from 2012 reviewed, only trace mitral regurgitation at that time.  Encouraged regular exercise in symptom observation. Annual followup arranged.  Atrial fibrillation Status post Cox-Maze procedure with mitral valve repair. She continues on aspirin and Toprol-XL. ECG shows normal sinus rhythm today.  HYPERTENSION, BENIGN ESSENTIAL Reasonable blood pressure today. No changes made.    Jonelle Sidle, M.D., F.A.C.C.

## 2014-03-30 ENCOUNTER — Encounter: Payer: Self-pay | Admitting: Cardiology

## 2014-09-13 ENCOUNTER — Encounter: Payer: Self-pay | Admitting: Cardiology

## 2014-09-13 ENCOUNTER — Ambulatory Visit (INDEPENDENT_AMBULATORY_CARE_PROVIDER_SITE_OTHER): Payer: BC Managed Care – PPO | Admitting: Cardiology

## 2014-09-13 VITALS — BP 101/62 | HR 70 | Ht 68.0 in | Wt 173.5 lb

## 2014-09-13 DIAGNOSIS — Z9889 Other specified postprocedural states: Secondary | ICD-10-CM

## 2014-09-13 DIAGNOSIS — I10 Essential (primary) hypertension: Secondary | ICD-10-CM

## 2014-09-13 DIAGNOSIS — Z8679 Personal history of other diseases of the circulatory system: Secondary | ICD-10-CM

## 2014-09-13 DIAGNOSIS — I4891 Unspecified atrial fibrillation: Secondary | ICD-10-CM

## 2014-09-13 NOTE — Assessment & Plan Note (Signed)
Sinus rhythm today by ECG. No obvious recurrences. She continues on beta blocker therapy.

## 2014-09-13 NOTE — Assessment & Plan Note (Signed)
Blood pressure is normal today. 

## 2014-09-13 NOTE — Patient Instructions (Signed)

## 2014-09-13 NOTE — Progress Notes (Signed)
    Clinical Summary Tracy Acosta is a 68 y.o.female last seen in October 2014. She presents for a followup visit. States that she has been doing very well, still exercises regularly at the gym, both organize classes, and also using exercise machines on her own. She reports no chest pains, no palpitations, NYHA class 1-2 dyspnea.  Echocardiogram from February 2012 revealed LVEF 60-65% with moderate LVH, trace mitral regurgitation, mean mitral gradient 7 mm mercury status post annular repair, mild tricuspid regurgitation.  ECG today shows normal sinus rhythm.   Allergies  Allergen Reactions  . Codeine     REACTION: palpitations,vomiting,sweating    Current Outpatient Prescriptions  Medication Sig Dispense Refill  . levothyroxine (SYNTHROID, LEVOTHROID) 125 MCG tablet Take 1 tablet (125 mcg total) by mouth daily.  90 tablet  0  . metoprolol succinate (TOPROL-XL) 50 MG 24 hr tablet Take 50 mg by mouth daily. Take with or immediately following a meal.      . risedronate (ACTONEL) 30 MG tablet Take 30 mg by mouth every 7 (seven) days. with water on empty stomach, nothing by mouth or lie down for next 30 minutes.       No current facility-administered medications for this visit.    Past Medical History  Diagnosis Date  . Atrial fibrillation     Status post Cox-Maze procedure at the time of mitral valve repair  . Hypothyroidism   . History of pneumonia   . Mitral regurgitation     Status post mitral valve repair    Past Surgical History  Procedure Laterality Date  . Arthroscopic left knee surgery    . Mitral valve repair  09/10/2010    Complex valvuloplasty with #61mm ring annuloplasty via right mini thoracotomy  . Cox-maze procedure  09/10/2010    Social History Ms. Tenorio reports that she has never smoked. She has never used smokeless tobacco. Ms. Ottaway reports that she does not drink alcohol.  Review of Systems Stable appetite. No dizziness or syncope. No claudication.  Other systems reviewed and negative.  Physical Examination Filed Vitals:   09/13/14 1332  BP: 101/62  Pulse: 70   Filed Weights   09/13/14 1332  Weight: 173 lb 8 oz (78.699 kg)    Tall woman, appears comfortable at rest.  HEENT: Conjunctiva and lids normal, oropharynx clear.  Neck: Supple, no elevated JVP or carotid bruits, no thyromegaly.  Lungs: Clear to auscultation, nonlabored breathing at rest.  Cardiac: Regular rate and rhythm, no S3 or significant systolic murmur, no pericardial rub.  Abdomen: Soft, nontender, bowel sounds present.  Extremities: No pitting edema, distal pulses 2+.  Skin: Warm and dry.  Musculoskeletal: No kyphosis.  Neuropsychiatric: Alert and oriented x3, affect grossly appropriate.   Problem List and Plan   S/P MVR (mitral valve repair) Symptomatically stable, no change on examination. At this point no clear indication for followup echocardiogram.  S/P Maze operation for atrial fibrillation Sinus rhythm today by ECG. No obvious recurrences. She continues on beta blocker therapy.  HYPERTENSION, BENIGN ESSENTIAL Blood pressure is normal today.    Satira Sark, M.D., F.A.C.C.

## 2014-09-13 NOTE — Assessment & Plan Note (Signed)
Symptomatically stable, no change on examination. At this point no clear indication for followup echocardiogram.

## 2015-01-19 ENCOUNTER — Telehealth: Payer: Self-pay | Admitting: *Deleted

## 2015-01-19 NOTE — Telephone Encounter (Signed)
Ms. Laurance Flatten, the daughter of Jacqui Headen, has called requesting an appointment to see Dr. Roxy Manns.  She says that she has been sick since December with a respiratory illness that is not any better.  Also, her toes and fingers have become grayish/blackish. She is not satisfied with the medical care she is receiving.  They cannot find anything wrong regarding her extremities.  She is s/p MVRepair/Maze 10/11/111.  I suggested she see her cardiologist, Dr. Domenic Polite and she agreed.  She is also concerned with her  groin incision site swelling.  I will give her an appointment to be seen in our office.

## 2015-01-22 ENCOUNTER — Ambulatory Visit (INDEPENDENT_AMBULATORY_CARE_PROVIDER_SITE_OTHER): Payer: BLUE CROSS/BLUE SHIELD | Admitting: Cardiology

## 2015-01-22 ENCOUNTER — Encounter: Payer: Self-pay | Admitting: Cardiology

## 2015-01-22 VITALS — BP 162/92 | HR 68 | Ht 68.0 in | Wt 167.0 lb

## 2015-01-22 DIAGNOSIS — R239 Unspecified skin changes: Secondary | ICD-10-CM

## 2015-01-22 DIAGNOSIS — Z9889 Other specified postprocedural states: Secondary | ICD-10-CM

## 2015-01-22 DIAGNOSIS — Z87898 Personal history of other specified conditions: Secondary | ICD-10-CM

## 2015-01-22 DIAGNOSIS — Z136 Encounter for screening for cardiovascular disorders: Secondary | ICD-10-CM

## 2015-01-22 DIAGNOSIS — R234 Changes in skin texture: Secondary | ICD-10-CM

## 2015-01-22 DIAGNOSIS — I059 Rheumatic mitral valve disease, unspecified: Secondary | ICD-10-CM

## 2015-01-22 NOTE — Patient Instructions (Signed)
Your physician recommends that you schedule a follow-up appointment in: 3-4 weeks with Dr. Domenic Polite  Your physician recommends that you continue on your current medications as directed. Please refer to the Current Medication list given to you today.  Your physician recommends that you return for lab work BEFORE YOUR NEXT VISIT   CBC/SED/X2 BLOOD CULTURES  Your physician has requested that you have an echocardiogram. Echocardiography is a painless test that uses sound waves to create images of your heart. It provides your doctor with information about the size and shape of your heart and how well your heart's chambers and valves are working. This procedure takes approximately one hour. There are no restrictions for this procedure.  Your physician has requested that you have an abdominal aorta duplex. During this test, an ultrasound is used to evaluate the aorta. Allow 30 minutes for this exam. Do not eat after midnight the day before and avoid carbonated beverages  Your physician has requested that you have a lower extremity arterial exercise duplex. During this test, exercise and ultrasound are used to evaluate arterial blood flow in the legs. Allow one hour for this exam. There are no restrictions or special instructions.  Thank you for choosing [facility]!!

## 2015-01-22 NOTE — Progress Notes (Signed)
Cardiology Office Note  Date: 01/22/2015   ID: Tracy, Acosta [birth year], MRN [id number]  PCP: Josem Kaufmann, MD  Primary Cardiologist: Rozann Lesches, MD   Chief Complaint  Patient presents with  . Mitral valve disease  . Atrial Fibrillation  . Toe, finger, and knee discoloration  . Recent "viral illness"    History of Present Illness: Tracy Acosta is a 69 y.o. female last seen in October 2015. She presents today with her son for a patient scheduled visit. She indicates that she had a influenza shot back in November 2015, a few weeks later got a "viral illness", and since that time has had significant discoloration of her toes on both feet, subsequently her fingers on the dorsal side, and then even plaque-like discoloration on her anterior patella and upper legs. She indicates 2 separate bouts of "viral infection" including as recently as last week. She has been seen by different providers at [facility] - I reviewed the most recent records provided by the patient. She is described as having possible sinusitis, was treated with antibiotics and steroid Dosepak. Her most recent lab work is noted below.  Patient indicates that her children are worried about her, son today states that he is concerned she may have a "blood clot" or an "aneurysm" based on research he and his sister have done on the Internet. Patient states she had a recent vascular health screening through her church, results are not available.  She reports mild fullness and vague tenderness in her left groin area dating back to the time of undergoing heart valve surgery in 2011. She reports that this has not changed. She is not describing the site of cardiac catheterization arteriotomy which was on the right.  She states that the areas described above have not been overly tender, she has no pain in her toes at this time, thinks that the discoloration is getting somewhat better, although has not resolved. Records  from February 16 at [facility] indicate that her temperature was 102.1.  She denies having any similar symptoms although has reportedly not tolerated influenza shots in the past.   Past Medical History  Diagnosis Date  . Atrial fibrillation     Status post Cox-Maze procedure at the time of mitral valve repair  . Hypothyroidism   . History of pneumonia   . Mitral regurgitation     Status post mitral valve repair    Past Surgical History  Procedure Laterality Date  . Arthroscopic left knee surgery    . Mitral valve repair  09/10/2010    Complex valvuloplasty with #25m ring annuloplasty via right mini thoracotomy  . Cox-maze procedure  09/10/2010    Current Outpatient Prescriptions  Medication Sig Dispense Refill  . Cholecalciferol (VITAMIN D3) 3000 UNITS TABS Take 1 tablet by mouth daily.    .Marland Kitchenlevofloxacin (LEVAQUIN) 750 MG tablet Take 750 mg by mouth daily.    .Marland Kitchenlevothyroxine (SYNTHROID, LEVOTHROID) 125 MCG tablet Take 1 tablet (125 mcg total) by mouth daily. 90 tablet 0  . metoprolol succinate (TOPROL-XL) 50 MG 24 hr tablet Take 50 mg by mouth daily. Take with or immediately following a meal.    . Omega-3 Fatty Acids (FISH OIL) 1200 MG CAPS Take 1 capsule by mouth daily.    . predniSONE (STERAPRED UNI-PAK) 10 MG tablet Take by mouth as directed.    . risedronate (ACTONEL) 30 MG tablet Take 30 mg by mouth every 7 (seven) days. with water on empty stomach, nothing  by mouth or lie down for next 30 minutes.     No current facility-administered medications for this visit.    Allergies:  Codeine   Social History: The patient  reports that she has never smoked. She has never used smokeless tobacco. She reports that she does not drink alcohol or use illicit drugs.   ROS:  Please see the history of present illness. Otherwise, complete review of systems is positive for none.  All other systems are reviewed and negative - she specifically denies arthralgias, palpitations, claudication,  nailbed changes.   Physical Exam: VS:  BP 162/92 mmHg  Pulse 68  Ht 5' 8"  (1.727 m)  Wt 167 lb (75.751 kg)  BMI 25.40 kg/m2  SpO2 98%, BMI Body mass index is 25.4 kg/(m^2).  Wt Readings from Last 3 Encounters:  01/22/15 167 lb (75.751 kg)  09/13/14 173 lb 8 oz (78.699 kg)  08/31/13 172 lb (78.019 kg)     Tall woman, appears comfortable at rest.  HEENT: Conjunctiva and lids normal, oropharynx clear.  Neck: Supple, no elevated JVP or carotid bruits, no thyromegaly.  Lungs: Clear to auscultation, nonlabored breathing at rest.  Cardiac: Regular rate and rhythm, no S3, soft systolic murmur, no pericardial rub.  Abdomen: Soft, nontender, bowel sounds present.  Extremities: No pitting edema, distal pulses normal including radials and dorsalis pedis bilaterally. Skin: Warm and dry. Dark discoloration predominantly dorsal service of toes bilaterally - nontender. Less noticeable changes on the dorsal surfaces of some of her fingers. More rounded patchy discoloration anterior patella and upper legs. Musculoskeletal: No kyphosis.  Neuropsychiatric: Alert and oriented x3, affect grossly appropriate.   ECG: ECG is not ordered today.   Recent Labwork:  Recent lab work from January showed hemoglobin 12.5, MCV 74, platelets 171, WBC 3.7, urinalysis essentially normal, cholesterol 162, HDL 46, triglycerides 62, LDL 106, potassium 4.5, BUN 10, creatinine 0.8, TSH 0.29, free T4 1 0.9, normal LFTs and albumin.  Other Studies Reviewed Today:  Echocardiogram ([facility]) 01/02/2011: Moderate LVH with LVEF 60-65%, status post mitral annuloplasty with trivial mitral regurgitation and mean gradient 7 mmHg, normal left atrial chamber size, mild tricuspid regurgitation with RVSP 40 mmHg.  Assessment and Plan:  1. Constellation of symptoms and findings as discussed above, reportedly beginning back in November 2015 after influenza shot and subsequent "viral infections." She has been documented to  be febrile recently, placed on antibiotics and steroid Dosepak at [facility] (question of sinusitis at that time). The patient's skin findings are concerning, although not all are in typical vascular territory, somewhat unusual for embolic phenomenon. She has no documented history of vascular disease or recent vascular instrumentation to suspect cholesterol embolization. This could potentially be a cryoglobulin phenomena, but with history of mitral valve repair, need to exclude endocarditis. Description of left mild groin tenderness is more long-standing, doubt acute vascular process. I explained to the patient and her son that I did not have a unifying diagnosis as yet, and that she may need additional specialized consultation. Plan at this time is to obtain an echocardiogram and vascular studies to include lower extremity arterial studies, also rule out aortic aneurysmal disease. Also plan to obtain 2 sets of blood cultures, CBC with differential, and ESR. We will bring her back to the office to discuss the results.  2. History of mitral valve repair in 2011 as outlined above. Echocardiogram is being updated.  3. History of previously documented atrial fibrillation status post Cox-Maze procedure at the time of mitral valve repair.  No obvious recurrences.  Current medicines are reviewed at length with the patient today.  The patient does not have concerns regarding medicines.   Orders Placed This Encounter  Procedures  . Culture, blood (single)  . Culture, blood (single)  . CBC w/Diff  . Sed Rate (ESR)  . 2D Echocardiogram without contrast  . Lower Extremity Arterial Doppler Bilateral  . Abdominal Aortic Aneurysm duplex    Disposition: FU with me in 2 weeks.   Signed, Satira Sark, MD, [facility] 01/22/2015 5:14 PM    [place] [facility] [place], [place], [place] [address] Phone: [phone]; Fax: [phone]

## 2015-01-25 ENCOUNTER — Other Ambulatory Visit: Payer: BLUE CROSS/BLUE SHIELD

## 2015-01-29 ENCOUNTER — Telehealth: Payer: Self-pay | Admitting: *Deleted

## 2015-01-29 NOTE — Telephone Encounter (Signed)
-----   Message from Satira Sark, MD sent at 01/29/2015  1:48 PM EST ----- Reviewed available results. White blood cell count is normal as is differential. Platelets remain mildly low at 120. Erythrocyte sedimentation rate is low, would argue against a chronic infectious or inflammatory process. Await other studies.

## 2015-01-31 ENCOUNTER — Telehealth: Payer: Self-pay | Admitting: *Deleted

## 2015-01-31 NOTE — Telephone Encounter (Signed)
-----   Message from Chanda Busing sent at 01/31/2015 11:09 AM EST ----- Regarding: RE: 3-4 week f/u appt with Domenic Polite hasn't been scheduled yet I spoke with Mrs. Polimeni yesterday afternoon. She wants to wait until she gets all testing done before she schedules to see Dr. Domenic Polite. ----- Message -----    From: Merlene Laughter, LPN    Sent: 05/08/1156  10:18 AM      To: Chanda Busing Subject: RE: 3-4 week f/u appt with Domenic Polite hasn't b#  Ok, thanks  ----- Message -----    From: Chanda Busing    Sent: 01/30/2015   7:43 AM      To: Merlene Laughter, LPN Subject: RE: 3-4 week f/u appt with Domenic Polite hasn't b#  Yes because she was going to call back with her new schedule. I will check with her today.  ----- Message -----    From: Merlene Laughter, LPN    Sent: 2/62/0355   9:33 AM      To: Chanda Busing Subject: 3-4 week f/u appt with Domenic Polite hasn't been #

## 2015-02-01 ENCOUNTER — Other Ambulatory Visit: Payer: Self-pay

## 2015-02-01 ENCOUNTER — Other Ambulatory Visit (INDEPENDENT_AMBULATORY_CARE_PROVIDER_SITE_OTHER): Payer: BLUE CROSS/BLUE SHIELD

## 2015-02-01 DIAGNOSIS — Z87898 Personal history of other specified conditions: Secondary | ICD-10-CM

## 2015-02-01 DIAGNOSIS — I059 Rheumatic mitral valve disease, unspecified: Secondary | ICD-10-CM | POA: Diagnosis not present

## 2015-02-01 DIAGNOSIS — Z9889 Other specified postprocedural states: Secondary | ICD-10-CM

## 2015-02-02 NOTE — Telephone Encounter (Signed)
-----   Message from Satira Sark, MD sent at 02/01/2015  4:50 PM EST ----- Reviewed report. LV systolic function is still normal range, mitral valve repair is described as being intact without any significant mitral regurgitation. No mention of any valvular vegetations.

## 2015-02-02 NOTE — Telephone Encounter (Signed)
Patient informed. 

## 2015-02-06 ENCOUNTER — Telehealth: Payer: Self-pay | Admitting: *Deleted

## 2015-02-06 NOTE — Telephone Encounter (Signed)
-----   Message from Satira Sark, MD sent at 02/05/2015  1:48 PM EST ----- Reviewed. Blood cultures negative.

## 2015-02-06 NOTE — Telephone Encounter (Signed)
Patient informed. 

## 2015-02-08 ENCOUNTER — Encounter (INDEPENDENT_AMBULATORY_CARE_PROVIDER_SITE_OTHER): Payer: Medicare Other

## 2015-02-08 ENCOUNTER — Other Ambulatory Visit: Payer: BLUE CROSS/BLUE SHIELD

## 2015-02-08 DIAGNOSIS — Z9889 Other specified postprocedural states: Secondary | ICD-10-CM

## 2015-02-08 DIAGNOSIS — I700 Atherosclerosis of aorta: Secondary | ICD-10-CM | POA: Diagnosis not present

## 2015-02-08 DIAGNOSIS — Z136 Encounter for screening for cardiovascular disorders: Secondary | ICD-10-CM

## 2015-02-08 DIAGNOSIS — R234 Changes in skin texture: Secondary | ICD-10-CM

## 2015-02-12 ENCOUNTER — Telehealth: Payer: Self-pay | Admitting: *Deleted

## 2015-02-12 NOTE — Telephone Encounter (Signed)
Patient informed. 

## 2015-02-12 NOTE — Telephone Encounter (Signed)
-----   Message from Satira Sark, MD sent at 02/08/2015  3:45 PM EST ----- Reviewed report. Please let her know that the aorta and the iliac systems showed no evidence of aneurysm.

## 2015-02-12 NOTE — Telephone Encounter (Signed)
-----   Message from Satira Sark, MD sent at 02/09/2015  1:57 PM EST ----- Reviewed report. Please let her know that the lower extremity arterial flow studies were normal.

## 2015-02-14 ENCOUNTER — Encounter: Payer: Self-pay | Admitting: Cardiology

## 2015-02-14 ENCOUNTER — Ambulatory Visit (INDEPENDENT_AMBULATORY_CARE_PROVIDER_SITE_OTHER): Payer: Medicare Other | Admitting: Cardiology

## 2015-02-14 VITALS — BP 108/70 | HR 79 | Ht 68.0 in | Wt 168.1 lb

## 2015-02-14 DIAGNOSIS — Z9889 Other specified postprocedural states: Secondary | ICD-10-CM

## 2015-02-14 DIAGNOSIS — R234 Changes in skin texture: Secondary | ICD-10-CM

## 2015-02-14 DIAGNOSIS — R239 Unspecified skin changes: Secondary | ICD-10-CM

## 2015-02-14 DIAGNOSIS — Z8679 Personal history of other diseases of the circulatory system: Secondary | ICD-10-CM

## 2015-02-14 NOTE — Progress Notes (Signed)
Cardiology Office Note  Date: 02/14/2015   ID: ANNALI Acosta, DOB 1946/06/06, MRN [id number]  PCP: Josem Kaufmann, MD  Primary Cardiologist: Rozann Lesches, MD   Chief Complaint  Patient presents with  . Follow-up testing    History of Present Illness: Tracy Acosta is a 69 y.o. female last seen in February. History reviewed in the prior note. Follow-up testing was arranged in light of her constellation of symptoms to exclude obvious vascular disease and endocarditis. She is here with her daughter today. The skin changes on her toes and knees continue to improve, she states that her hands are back to normal. She tells her that she was seen by a podiatrist who told her that she might have Raynaud's disease.  We reviewed the results of her testing, all of which are outlined below. Results are reassuring overall, no clear evidence of endocarditis with negative blood cultures and intact mitral valve repair, no clear evidence of obstructive vascular disease or abdominal aortic aneurysm. She continues to be mildly thrombocytopenic and has a low MCV. Her ESR was normal.  I explained to the patient and her daughter today that I did not think she had a cardiovascular cause for her symptomatology. Still not entirely clear what happened, whether this was related to a cryoglobulin phenomena with associated infection, perhaps even secondary to her influenza injection. I explained that she really should establish with an internal medicine physician to follow her health over time and help coordinate her care.   Past Medical History  Diagnosis Date  . Atrial fibrillation     Status post Cox-Maze procedure at the time of mitral valve repair  . Hypothyroidism   . History of pneumonia   . Mitral regurgitation     Status post mitral valve repair    Past Surgical History  Procedure Laterality Date  . Arthroscopic left knee surgery    . Mitral valve repair  09/10/2010    Complex  valvuloplasty with #10m ring annuloplasty via right mini thoracotomy  . Cox-maze procedure  09/10/2010    Current Outpatient Prescriptions  Medication Sig Dispense Refill  . Cholecalciferol (VITAMIN D3) 3000 UNITS TABS Take 1 tablet by mouth daily.    .Marland Kitchenlevothyroxine (SYNTHROID, LEVOTHROID) 125 MCG tablet Take 1 tablet (125 mcg total) by mouth daily. 90 tablet 0  . metoprolol succinate (TOPROL-XL) 50 MG 24 hr tablet Take 50 mg by mouth daily. Take with or immediately following a meal.    . Omega-3 Fatty Acids (FISH OIL) 1200 MG CAPS Take 1 capsule by mouth daily.    . risedronate (ACTONEL) 30 MG tablet Take 30 mg by mouth every 7 (seven) days. with water on empty stomach, nothing by mouth or lie down for next 30 minutes.     No current facility-administered medications for this visit.    Allergies:  Codeine   Social History: The patient  reports that she has never smoked. She has never used smokeless tobacco. She reports that she does not drink alcohol or use illicit drugs.   ROS:  Please see the history of present illness. Otherwise, complete review of systems is positive for none.  All other systems are reviewed and negative.    Physical Exam: VS:  BP 108/70 mmHg  Pulse 79  Ht 5' 8"  (1.727 m)  Wt 168 lb 1.9 oz (76.259 kg)  BMI 25.57 kg/m2  SpO2 95%, BMI Body mass index is 25.57 kg/(m^2).  Wt Readings from Last 3 Encounters:  02/14/15  168 lb 1.9 oz (76.259 kg)  01/22/15 167 lb (75.751 kg)  09/13/14 173 lb 8 oz (78.699 kg)     Tall woman, appears comfortable at rest.  HEENT: Conjunctiva and lids normal, oropharynx clear.  Neck: Supple, no elevated JVP or carotid bruits, no thyromegaly.  Lungs: Clear to auscultation, nonlabored breathing at rest.  Cardiac: Regular rate and rhythm, no S3, soft systolic murmur, no pericardial rub.  Abdomen: Soft, nontender, bowel sounds present.  Extremities: No pitting edema, distal pulses normal including radials and dorsalis pedis  bilaterally. Skin: Warm and dry. Improving discoloration dorsal service of toes bilaterally. Rounded patchy discoloration anterior patella and upper legs also improving. Musculoskeletal: No kyphosis.  Neuropsychiatric: Alert and oriented x3, affect grossly appropriate.   ECG: ECG is not ordered today.   Recent Labwork:  Two sets of blood cultures from February were negative. WBC 7.8, hemoglobin 12.4, platelets 120, ESR 1.  Other Studies Reviewed Today:  Echocardiogram 02/01/2015: Study Conclusions  - Left ventricle: Upper septal thickening. No LVOT gradient. The cavity size was normal. The estimated ejection fraction was 60%. Wall motion was normal; there were no regional wall motion abnormalities. - Mitral valve: The mitral valve repair is intact. There is no significant MR or MS. - Left atrium: The atrium was mildly dilated. - Right ventricle: Not able to assess RV function due to poor visualization. Not able to assess RV size due to poor visualization.  Abdominal ultrasound 02/08/2015: Normal caliber abdominal aorta and proximal common iliac arteries without evidence of focal dilatation. Aortoiliac atherosclerosis without stenosis. Patent IVC.  Lower extremity arterial Dopplers 02/09/2015: Normal ABIs bilaterally with normal great toe brachial indices bilaterally.   Assessment and Plan:  1. Recent cardiac testing argues against endocarditis or obstructive vascular disease. As noted previously, embolic phenomenon is also not suspected. I have asked that she consider establishing with an internal medicine physician to provide regular health care follow-up and coordinate further evaluation as needed.  2. History of mitral valve repair in 2011, stable by recent follow-up echocardiogram.  3. History of atrial fibrillation status post Cox [place] procedure, has maintained sinus rhythm throughout.  Current medicines are reviewed at length with the patient today.  The  patient does not have concerns regarding medicines.  Disposition: FU with me in 6 months.   Signed, Satira Sark, MD, [facility] 02/14/2015 3:25 PM    [place] [facility] [place], [place], [place] [address] Phone: [phone]; Fax: [phone]

## 2015-02-14 NOTE — Patient Instructions (Signed)

## 2015-05-22 ENCOUNTER — Telehealth: Payer: Self-pay | Admitting: Cardiology

## 2015-05-22 NOTE — Telephone Encounter (Signed)
Unless she is symptomatic, which it does not appear to be so, she can follow up in Sept as scheduled.  Thanks

## 2015-05-22 NOTE — Telephone Encounter (Signed)
Patient wanted to know if she needed to be seen sooner since she was seeing a hematologist for low platelet count. Patient said she was doing fine cardiac wise and denied chest pain, dizziness or sob. Nurse advised patient that is was okay for her to see Tracy Acosta in September and if her hematologist thinks she need to see Tracy Acosta sooner, then they should contact us at that time. Patient verbalized understanding of plan. Patient said she wanted [name] to be aware of what is going on with her. Patient advised that MD was out of the office this week but would be able to see this message upon his return to the office.

## 2015-05-22 NOTE — Telephone Encounter (Signed)
Patient calls stating that she is seeing a Hematologist in [place], [place].  States that he is giving her injections and she is wanting to know If Dr. Domenic Polite needs to see her before September.

## 2015-05-24 NOTE — Telephone Encounter (Signed)
Patient notified

## 2015-08-21 ENCOUNTER — Ambulatory Visit (INDEPENDENT_AMBULATORY_CARE_PROVIDER_SITE_OTHER): Payer: Medicare Other | Admitting: Cardiology

## 2015-08-21 ENCOUNTER — Encounter: Payer: Self-pay | Admitting: Cardiology

## 2015-08-21 VITALS — BP 123/63 | HR 84 | Ht 68.0 in | Wt 154.1 lb

## 2015-08-21 DIAGNOSIS — Q069 Congenital malformation of spinal cord, unspecified: Secondary | ICD-10-CM

## 2015-08-21 DIAGNOSIS — Z9889 Other specified postprocedural states: Secondary | ICD-10-CM

## 2015-08-21 DIAGNOSIS — Z8679 Personal history of other diseases of the circulatory system: Secondary | ICD-10-CM

## 2015-08-21 DIAGNOSIS — IMO0001 Reserved for inherently not codable concepts without codable children: Secondary | ICD-10-CM

## 2015-08-21 DIAGNOSIS — D469 Myelodysplastic syndrome, unspecified: Secondary | ICD-10-CM

## 2015-08-21 MED ORDER — METOPROLOL SUCCINATE ER 50 MG PO TB24: 50.0000 mg | ORAL_TABLET | Freq: Every day | ORAL | Status: AC

## 2015-08-21 NOTE — Patient Instructions (Signed)

## 2015-08-21 NOTE — Progress Notes (Signed)
Cardiology Office Note  Date: 08/21/2015   Tracy: EDIT Acosta, DOB 1946-02-24, MRN [id number]  PCP: Tracy Kaufmann, MD  Primary Cardiologist: Tracy Lesches, MD   Chief Complaint  Patient presents with  . Mitral valve disease    History of Present Illness: Tracy Acosta is a 69 y.o. female last seen in March. She presents with her daughter for a follow-up visit. I do not have all the details, but apparently she has been diagnosed with myelodysplasia and is now followed by Tracy Acosta, a hematologist/oncologist in [place]. She is undergoing treatment at this time and will be having a follow-up bone marrow biopsy some time around the end of the year. She reports intermittent fatigue and nausea associated with her treatments. Recent lab work is outlined below.  From a cardiac perspective, she does not endorse any palpitations. She had an echocardiogram done earlier in the year that showed normal LVEF and stable mitral valve repair with no significant mitral regurgitation or mitral stenosis.  I reviewed her medications. She remains on Toprol-XL.   Past Medical History  Diagnosis Date  . Atrial fibrillation     Status post Cox-Maze procedure at the time of mitral valve repair  . Hypothyroidism   . History of pneumonia   . Mitral regurgitation     Status post mitral valve repair    Past Surgical History  Procedure Laterality Date  . Arthroscopic left knee surgery    . Mitral valve repair  09/10/2010    Complex valvuloplasty with #86m ring annuloplasty via right mini thoracotomy  . Cox-maze procedure  09/10/2010    Current Outpatient Prescriptions  Medication Sig Dispense Refill  . AzaCITIDine (VIDAZA [place]) Inject into the skin. 5 injections x 5 days in a row per month    . Cholecalciferol (VITAMIN D3) 3000 UNITS TABS Take 1 tablet by mouth daily.    . Iron-Vitamins (GERITOL PO) 1 tablespoon daily    . levothyroxine (SYNTHROID, LEVOTHROID) 125 MCG tablet Take 1 tablet (125  mcg total) by mouth daily. 90 tablet 0  . metoprolol succinate (TOPROL-XL) 50 MG 24 hr tablet Take 1 tablet (50 mg total) by mouth daily. Take with or immediately following a meal. 90 tablet 3  . Omega-3 Fatty Acids (FISH OIL) 1200 MG CAPS Take 1 capsule by mouth daily.    . ondansetron (ZOFRAN) 4 MG tablet Take 4 mg by mouth as needed for nausea or vomiting (on days she does her chemo).     No current facility-administered medications for this visit.    Allergies:  Codeine   Social History: The patient  reports that she has never smoked. She has never used smokeless tobacco. She reports that she does not drink alcohol or use illicit drugs.   ROS:  Please see the history of present illness. Otherwise, complete review of systems is positive for intermittent fatigue and nausea.  All other systems are reviewed and negative.   Physical Exam: VS:  BP 123/63 mmHg  Pulse 84  Ht 5' 8"  (1.727 m)  Wt 154 lb 1.9 oz (69.908 kg)  BMI 23.44 kg/m2  SpO2 99%, BMI Body mass index is 23.44 kg/(m^2).  Wt Readings from Last 3 Encounters:  08/21/15 154 lb 1.9 oz (69.908 kg)  02/14/15 168 lb 1.9 oz (76.259 kg)  01/22/15 167 lb (75.751 kg)     Tall woman, appears comfortable at rest.  HEENT: Conjunctiva with some injection, lids normal, oropharynx clear.  Neck: Supple, no elevated JVP  or carotid bruits, no thyromegaly.  Lungs: Clear to auscultation, nonlabored breathing at rest.  Cardiac: Regular rate and rhythm, no S3, soft systolic murmur, no pericardial rub.  Abdomen: Soft, nontender, bowel sounds present.  Extremities: No pitting edema, distal pulses normal including radials and dorsalis pedis bilaterally. Skin: Warm and dry.  ECG: ECG is not ordered today.   Recent Labwork:  08/20/2015: WBC 2.0, hemoglobin 9.5, platelets 69, TSH 0.53.  Other Studies Reviewed Today:  Echocardiogram 02/01/2015: Study Conclusions  - Left ventricle: Upper septal thickening. No LVOT gradient.  The cavity size was normal. The estimated ejection fraction was 60%. Wall motion was normal; there were no regional wall motion abnormalities. - Mitral valve: The mitral valve repair is intact. There is no significant MR or MS. - Left atrium: The atrium was mildly dilated. - Right ventricle: Not able to assess RV function due to poor visualization. Not able to assess RV size due to poor visualization.  Abdominal ultrasound 02/08/2015: Normal caliber abdominal aorta and proximal common iliac arteries without evidence of focal dilatation. Aortoiliac atherosclerosis without stenosis. Patent IVC.  Lower extremity arterial Dopplers 02/09/2015: Normal ABIs bilaterally with normal great toe brachial indices bilaterally.  Assessment and Plan:  1. History of mitral valve repair with previously documented mitral regurgitation, stable by recent follow-up echocardiogram earlier in the year. At this point we will continue annual observation.  2. History of perioperative atrial fibrillation status post Cox-Maze procedure, no obvious recurrences, not on anticoagulation.  3. Pancytopenia with reported mild dysplasia, now undergoing treatments and under the care of Tracy Acosta in [place].  Current medicines were reviewed with the patient today.  Disposition: FU with me in 1 year.   Signed, Tracy Sark, MD, [facility] 08/21/2015 2:36 PM    [place] [facility] [place], [place], [place] [address] Phone: [phone]; Fax: [phone]

## 2015-09-07 ENCOUNTER — Telehealth: Payer: Self-pay | Admitting: Cardiology

## 2015-09-07 NOTE — Telephone Encounter (Signed)
Called Mrs. Boswells daughter to set up an appointment.  Left message asking Mrs. Moore to return call. Please set up appointment with Dr. Domenic Polite or extender in the [place] office.

## 2015-09-07 NOTE — Telephone Encounter (Signed)
Tracy Acosta (daughter) called stating that on 08-31-15 Tracy Acosta was admitted to [facility] with atrial fibrillation. States that she developed pneumonia. Family feels like the Chemo has damaged Tracy Acosta's heart. She is very fatigue.  She was moved to a nursing home facility in [place], [place].  Daughter wants to have Dr. Domenic Polite see her As soon as possible due to possible damage to her heart.  Please call Tracy Acosta on cell #.

## 2015-09-10 ENCOUNTER — Inpatient Hospital Stay (HOSPITAL_COMMUNITY)
Admission: EM | Admit: 2015-09-10 | Discharge: 2015-09-13 | DRG: 193 | Disposition: A | Discharge status: Skilled Nursing Facility | Payer: Medicare Other | Attending: Family Medicine | Admitting: Family Medicine

## 2015-09-10 ENCOUNTER — Emergency Department (HOSPITAL_COMMUNITY): Payer: Medicare Other

## 2015-09-10 ENCOUNTER — Encounter (HOSPITAL_COMMUNITY): Payer: Self-pay | Admitting: Adult Health

## 2015-09-10 DIAGNOSIS — Z79899 Other long term (current) drug therapy: Secondary | ICD-10-CM

## 2015-09-10 DIAGNOSIS — I10 Essential (primary) hypertension: Secondary | ICD-10-CM | POA: Diagnosis present

## 2015-09-10 DIAGNOSIS — E039 Hypothyroidism, unspecified: Secondary | ICD-10-CM | POA: Diagnosis present

## 2015-09-10 DIAGNOSIS — J90 Pleural effusion, not elsewhere classified: Secondary | ICD-10-CM | POA: Diagnosis present

## 2015-09-10 DIAGNOSIS — D469 Myelodysplastic syndrome, unspecified: Secondary | ICD-10-CM | POA: Diagnosis present

## 2015-09-10 DIAGNOSIS — M1990 Unspecified osteoarthritis, unspecified site: Secondary | ICD-10-CM | POA: Diagnosis present

## 2015-09-10 DIAGNOSIS — I4891 Unspecified atrial fibrillation: Secondary | ICD-10-CM | POA: Diagnosis present

## 2015-09-10 DIAGNOSIS — Z8701 Personal history of pneumonia (recurrent): Secondary | ICD-10-CM | POA: Diagnosis not present

## 2015-09-10 DIAGNOSIS — T451X5A Adverse effect of antineoplastic and immunosuppressive drugs, initial encounter: Secondary | ICD-10-CM | POA: Diagnosis present

## 2015-09-10 DIAGNOSIS — Z8679 Personal history of other diseases of the circulatory system: Secondary | ICD-10-CM

## 2015-09-10 DIAGNOSIS — R0902 Hypoxemia: Secondary | ICD-10-CM | POA: Diagnosis present

## 2015-09-10 DIAGNOSIS — M7989 Other specified soft tissue disorders: Secondary | ICD-10-CM | POA: Diagnosis present

## 2015-09-10 DIAGNOSIS — Z7901 Long term (current) use of anticoagulants: Secondary | ICD-10-CM

## 2015-09-10 DIAGNOSIS — Y95 Nosocomial condition: Secondary | ICD-10-CM | POA: Diagnosis present

## 2015-09-10 DIAGNOSIS — Z952 Presence of prosthetic heart valve: Secondary | ICD-10-CM | POA: Diagnosis not present

## 2015-09-10 DIAGNOSIS — Z9889 Other specified postprocedural states: Secondary | ICD-10-CM

## 2015-09-10 DIAGNOSIS — D61810 Antineoplastic chemotherapy induced pancytopenia: Secondary | ICD-10-CM | POA: Diagnosis present

## 2015-09-10 DIAGNOSIS — J948 Other specified pleural conditions: Secondary | ICD-10-CM | POA: Diagnosis not present

## 2015-09-10 DIAGNOSIS — R0600 Dyspnea, unspecified: Secondary | ICD-10-CM | POA: Diagnosis present

## 2015-09-10 DIAGNOSIS — J189 Pneumonia, unspecified organism: Principal | ICD-10-CM | POA: Diagnosis present

## 2015-09-10 LAB — URINALYSIS, ROUTINE W REFLEX MICROSCOPIC
BILIRUBIN URINE: NEGATIVE
Glucose, UA: NEGATIVE mg/dL
Hgb urine dipstick: NEGATIVE
KETONES UR: NEGATIVE mg/dL
Leukocytes, UA: NEGATIVE
NITRITE: NEGATIVE
PH: 7.5 (ref 5.0–8.0)
PROTEIN: NEGATIVE mg/dL
Specific Gravity, Urine: 1.006 (ref 1.005–1.030)
UROBILINOGEN UA: 0.2 mg/dL (ref 0.0–1.0)

## 2015-09-10 LAB — COMPREHENSIVE METABOLIC PANEL
ALBUMIN: 2.6 g/dL — AB (ref 3.5–5.0)
ALT: 20 U/L (ref 14–54)
ANION GAP: 10 (ref 5–15)
AST: 33 U/L (ref 15–41)
Alkaline Phosphatase: 93 U/L (ref 38–126)
BILIRUBIN TOTAL: 0.7 mg/dL (ref 0.3–1.2)
BUN: 5 mg/dL — ABNORMAL LOW (ref 6–20)
CHLORIDE: 95 mmol/L — AB (ref 101–111)
CO2: 30 mmol/L (ref 22–32)
Calcium: 8.1 mg/dL — ABNORMAL LOW (ref 8.9–10.3)
Creatinine, Ser: 0.68 mg/dL (ref 0.44–1.00)
GFR calc Af Amer: >60 mL/min (ref >60)
GFR calc non Af Amer: >60 mL/min (ref >60)
GLUCOSE: 98 mg/dL (ref 65–99)
POTASSIUM: 4.3 mmol/L (ref 3.5–5.1)
SODIUM: 135 mmol/L (ref 135–145)
TOTAL PROTEIN: 6.3 g/dL — AB (ref 6.5–8.1)

## 2015-09-10 LAB — CBC WITH DIFFERENTIAL/PLATELET
BASOS ABS: 0 10*3/uL (ref 0.0–0.1)
BASOS PCT: 0 %
EOS ABS: 0.1 10*3/uL (ref 0.0–0.7)
EOS PCT: 1 %
HEMATOCRIT: 33.8 % — AB (ref 36.0–46.0)
Hemoglobin: 9.8 g/dL — ABNORMAL LOW (ref 12.0–15.0)
Lymphocytes Relative: 29 %
Lymphs Abs: 1.1 10*3/uL (ref 0.7–4.0)
MCH: 21.8 pg — ABNORMAL LOW (ref 26.0–34.0)
MCHC: 29 g/dL — AB (ref 30.0–36.0)
MCV: 75.3 fL — ABNORMAL LOW (ref 78.0–100.0)
MONO ABS: 0.1 10*3/uL (ref 0.1–1.0)
MONOS PCT: 3 %
Neutro Abs: 2.6 10*3/uL (ref 1.7–7.7)
Neutrophils Relative %: 67 %
PLATELETS: 141 10*3/uL — AB (ref 150–400)
RBC: 4.49 MIL/uL (ref 3.87–5.11)
RDW: 19.4 % — AB (ref 11.5–15.5)
WBC: 3.9 10*3/uL — ABNORMAL LOW (ref 4.0–10.5)

## 2015-09-10 LAB — I-STAT TROPONIN, ED: TROPONIN I, POC: 0 ng/mL (ref 0.00–0.08)

## 2015-09-10 LAB — BRAIN NATRIURETIC PEPTIDE: B NATRIURETIC PEPTIDE 5: 153.5 pg/mL — AB (ref 0.0–100.0)

## 2015-09-10 MED ORDER — ACETAMINOPHEN 325 MG PO TABS
650.0000 mg | ORAL_TABLET | ORAL | Status: DC | PRN
Start: 2015-09-10 — End: 2015-09-13
  Administered 2015-09-11: 650 mg via ORAL
  Administered 2015-09-11: 325 mg via ORAL
  Administered 2015-09-11 – 2015-09-12 (×4): 650 mg via ORAL
  Filled 2015-09-10 (×5): qty 2

## 2015-09-10 MED ORDER — MAGNESIUM HYDROXIDE 400 MG/5ML PO SUSP
30.0000 mL | Freq: Every day | ORAL | Status: DC | PRN
  Administered 2015-09-11: 30 mL via ORAL
  Filled 2015-09-10 (×3): qty 30

## 2015-09-10 MED ORDER — METOPROLOL SUCCINATE ER 50 MG PO TB24
50.0000 mg | ORAL_TABLET | Freq: Every day | ORAL | Status: DC
  Administered 2015-09-11 – 2015-09-13 (×3): 50 mg via ORAL
  Filled 2015-09-10 (×4): qty 1

## 2015-09-10 MED ORDER — PROCHLORPERAZINE MALEATE 10 MG PO TABS
10.0000 mg | ORAL_TABLET | Freq: Every day | ORAL | Status: DC | PRN
  Filled 2015-09-10: qty 1

## 2015-09-10 MED ORDER — LEVOTHYROXINE SODIUM 125 MCG PO TABS
125.0000 ug | ORAL_TABLET | Freq: Every day | ORAL | Status: DC
  Administered 2015-09-11 – 2015-09-13 (×3): 125 ug via ORAL
  Filled 2015-09-10 (×4): qty 1

## 2015-09-10 MED ORDER — PIPERACILLIN-TAZOBACTAM 3.375 G IVPB 30 MIN: 3.3750 g | Freq: Three times a day (TID) | INTRAVENOUS | Status: DC

## 2015-09-10 MED ORDER — VANCOMYCIN HCL IN DEXTROSE 1-5 GM/200ML-% IV SOLN
1000.0000 mg | Freq: Two times a day (BID) | INTRAVENOUS | Status: DC
  Administered 2015-09-11 – 2015-09-12 (×2): 1000 mg via INTRAVENOUS
  Filled 2015-09-10 (×3): qty 200

## 2015-09-10 MED ORDER — SODIUM CHLORIDE 0.45 % IV SOLN: INTRAVENOUS | Status: DC

## 2015-09-10 MED ORDER — VITAMIN D 1000 UNITS PO TABS
2000.0000 [IU] | ORAL_TABLET | Freq: Every day | ORAL | Status: DC
  Administered 2015-09-11 – 2015-09-13 (×3): 2000 [IU] via ORAL
  Filled 2015-09-10 (×3): qty 2

## 2015-09-10 MED ORDER — METOPROLOL SUCCINATE ER 25 MG PO TB24: 50.0000 mg | ORAL_TABLET | Freq: Every day | ORAL | Status: DC

## 2015-09-10 MED ORDER — LORAZEPAM 0.5 MG PO TABS
0.5000 mg | ORAL_TABLET | Freq: Every evening | ORAL | Status: DC | PRN
  Administered 2015-09-11 – 2015-09-12 (×3): 0.5 mg via ORAL
  Filled 2015-09-10 (×3): qty 1

## 2015-09-10 MED ORDER — PIPERACILLIN-TAZOBACTAM 3.375 G IVPB
3.3750 g | Freq: Three times a day (TID) | INTRAVENOUS | Status: DC
  Administered 2015-09-11 – 2015-09-12 (×4): 3.375 g via INTRAVENOUS
  Filled 2015-09-10 (×7): qty 50

## 2015-09-10 MED ORDER — VANCOMYCIN HCL 10 G IV SOLR
1500.0000 mg | Freq: Once | INTRAVENOUS | Status: AC
  Administered 2015-09-11: 1500 mg via INTRAVENOUS
  Filled 2015-09-10: qty 1500

## 2015-09-10 MED ORDER — GUAIFENESIN 100 MG/5ML PO SOLN
15.0000 mL | ORAL | Status: DC | PRN
  Administered 2015-09-11 – 2015-09-12 (×4): 300 mg via ORAL
  Filled 2015-09-10 (×8): qty 15

## 2015-09-10 NOTE — ED Notes (Signed)
Presents from [facility] with SOB while doing PT-PT therapist noticed O2 level dropping with exertion and had chest x ray ordered-chest x ray showed LArge pleural effusion. Told to come immeadiately here to have fluid drained off. Speaking in full sentences, denies pain in chest, reports cough and pain with cough denies production of cough-sats 96% RA.

## 2015-09-10 NOTE — Progress Notes (Signed)
ANTIBIOTIC CONSULT NOTE - INITIAL  Pharmacy Consult for vancomycin  Indication: rule out pneumonia  Allergies  Allergen Reactions  . Codeine Nausea And Vomiting, Palpitations and Other (See Comments)    DIAPHORESIS  . Mirtazapine Other (See Comments)    HYPERACTIVITY    Patient Measurements: Height: 5\' 8"  (172.7 cm) Weight: 156 lb 7 oz (70.96 kg) IBW/kg (Calculated) : 63.9 Adjusted Body Weight:   Vital Signs: Temp: 98.3 F (36.8 C) (10/10 1937) Temp Source: Oral (10/10 1937) BP: 129/93 mmHg (10/10 2345) Pulse Rate: 47 (10/10 2345) Intake/Output from previous day:   Intake/Output from this shift:    Labs:  Recent Labs  09/10/15 2049  WBC 3.9*  HGB 9.8*  PLT 141*  CREATININE 0.68   Estimated Creatinine Clearance: 65.1 mL/min (by C-G formula based on Cr of 0.68). No results for input(s): VANCOTROUGH, VANCOPEAK, VANCORANDOM, GENTTROUGH, GENTPEAK, GENTRANDOM, TOBRATROUGH, TOBRAPEAK, TOBRARND, AMIKACINPEAK, AMIKACINTROU, AMIKACIN in the last 72 hours.   Microbiology: No results found for this or any previous visit (from the past 720 hour(s)).  Medical History: Past Medical History  Diagnosis Date  . Atrial fibrillation ([place])     Status post Cox-Maze procedure at the time of mitral valve repair  . Hypothyroidism   . History of pneumonia   . Mitral regurgitation     Status post mitral valve repair    Medications:  Anti-infectives    Start     Dose/Rate Route Frequency Ordered Stop   09/11/15 1300  vancomycin (VANCOCIN) IVPB 1000 mg/200 mL premix     1,000 mg 200 mL/hr over 60 Minutes Intravenous Every 12 hours 09/10/15 2355     09/11/15 0000  piperacillin-tazobactam (ZOSYN) IVPB 3.375 g  Status:  Discontinued     3.375 g 100 mL/hr over 30 Minutes Intravenous 3 times per day 09/10/15 2347 09/10/15 2349   09/11/15 0000  piperacillin-tazobactam (ZOSYN) IVPB 3.375 g  Status:  Discontinued     3.375 g 100 mL/hr over 30 Minutes Intravenous 3 times per day  09/10/15 2349 09/10/15 2350   09/11/15 0000  piperacillin-tazobactam (ZOSYN) IVPB 3.375 g     3.375 g 12.5 mL/hr over 240 Minutes Intravenous Every 8 hours 09/10/15 2351     09/11/15 0000  vancomycin (VANCOCIN) 1,500 mg in sodium chloride 0.9 % 500 mL IVPB     1,500 mg 250 mL/hr over 120 Minutes Intravenous  Once 09/10/15 2354       Assessment: 68 yof presented to the ED with SOB. Found to have a pleural effusion and to start broad-spectrum antibiotics with vancomycin + zosyn. Pt is afebrile and WBC is low at 3.9. Scr is WNL.   Vanc 10/11>> Zosyn 10/11>>  Goal of Therapy:  Vancomycin trough level 15-20 mcg/ml  Plan:  - Vancomycin 1500mg  IV x 1 then 1gm IV Q12H - Continue zosyn per MD - F/u renal fxn, C&S, clinical status and trough at [place], Rande Lawman 09/10/2015,11:55 PM

## 2015-09-10 NOTE — ED Provider Notes (Signed)
CSN: [id number]     Arrival date & time 09/10/15  1842 History   First MD Initiated Contact with Patient 09/10/15 1958     Chief Complaint  Patient presents with  . Pleural Effusion     (Consider location/radiation/quality/duration/timing/severity/associated sxs/prior Treatment) The history is provided by the patient. No language interpreter was used.  Tracy Acosta is a 69 y.o female with a history of recent diagnosis of myelodysplasia and is now followed by [facility] with hematology oncology in [place]. She is undergoing treatment and will have a follow-up bone marrow biopsy at the end of the year. She also had an echocardiogram earlier this year which short and normal LVEF and a mitral valve repair with no mitral regurg or stenosis.   Today she presents from cardiac rehabilitation with shortness of breath while doing PT. The therapist noticed that her oxygen level dropping with exertion and ordered a chest x-ray which showed a large pleural effusion. She was told to come to the ED. She states about 10 days ago she had a fever and she was diagnosed with pneumonia, hospitalized for 2 days. She was on anabiotic's and discharged. She denies having a cough but her daughter states she has a persistent dry cough. She admits to noticing some lower extremity edema that began yesterday.  She denies any recent fever, chills, chest pain, abdominal pain, nausea, vomiting, or urinary symptoms. She denies any recent shortness of breath with exertion.   Past Medical History  Diagnosis Date  . Atrial fibrillation ([place])     Status post Cox-Maze procedure at the time of mitral valve repair  . Hypothyroidism   . History of pneumonia   . Mitral regurgitation     Status post mitral valve repair   Past Surgical History  Procedure Laterality Date  . Arthroscopic left knee surgery    . Mitral valve repair  09/10/2010    Complex valvuloplasty with #65m ring annuloplasty via right mini thoracotomy  . Cox-maze  procedure  09/10/2010   History reviewed. No pertinent family history. Social History  Substance Use Topics  . Smoking status: Never Smoker   . Smokeless tobacco: Never Used  . Alcohol Use: No   OB History    No data available     Review of Systems  All other systems reviewed and are negative.     Allergies  Codeine and Mirtazapine  Home Medications   Prior to Admission medications   Medication Sig Start Date End Date Taking? Authorizing Provider  acetaminophen (TYLENOL) 500 MG tablet Take 1,000 mg by mouth every 6 (six) hours as needed for moderate pain.   Yes Historical Provider, MD  Cholecalciferol 2000 UNITS TABS Take 2,000 Units by mouth daily.   Yes Historical Provider, MD  guaiFENesin (ROBITUSSIN) 100 MG/5ML SOLN Take 15 mLs by mouth every 4 (four) hours as needed for cough or to loosen phlegm.   Yes Historical Provider, MD  levothyroxine (SYNTHROID, LEVOTHROID) 125 MCG tablet Take 1 tablet (125 mcg total) by mouth daily. 04/13/12  Yes GEzra Sites MD  LORazepam (ATIVAN) 0.5 MG tablet Take 0.5 mg by mouth at bedtime as needed for anxiety or sleep.   Yes Historical Provider, MD  magnesium hydroxide (MILK OF MAGNESIA) 400 MG/5ML suspension Take 30 mLs by mouth daily as needed for moderate constipation.   Yes Historical Provider, MD  ondansetron (ZOFRAN) 4 MG tablet Take 4 mg by mouth as needed for nausea or vomiting (on days she does her chemo).  Yes Historical Provider, MD  prochlorperazine (COMPAZINE) 10 MG tablet Take 10 mg by mouth daily as needed for nausea or vomiting.  06/27/15  Yes Historical Provider, MD  AzaCITIDine (VIDAZA [place]) Inject into the skin. 5 injections x 5 days in a row per month    Historical Provider, MD  fluticasone (FLONASE) 50 MCG/ACT nasal spray Place 2 sprays into both nostrils daily.    Historical Provider, MD  metoprolol succinate (TOPROL-XL) 50 MG 24 hr tablet Take 1 tablet (50 mg total) by mouth daily. Take with or immediately following a meal.  08/21/15   Satira Sark, MD   BP 144/81 mmHg  Pulse 81  Temp(Src) 98 F (36.7 C) (Oral)  Resp 22  Ht 5' 8"  (1.727 m)  Wt 154 lb 5.2 oz (70 kg)  BMI 23.47 kg/m2  SpO2 98% Physical Exam  Constitutional: She is oriented to person, place, and time. She appears well-developed and well-nourished.  Well-appearing and in no acute respiratory distress.  HENT:  Head: Normocephalic and atraumatic.  Eyes: Conjunctivae are normal.  Neck: Normal range of motion. Neck supple.  Cardiovascular: Normal rate and regular rhythm.   Pulmonary/Chest: Effort normal.  Decreased breath sounds in the left lower lung fields. No wheezing. No rhonchi. 95-99% oxygen on room air.  Abdominal: Soft. There is no tenderness.  Abdomen is soft and nontender. No distention. No guarding or rebound.  Musculoskeletal: Normal range of motion.  Mild bilateral lower extremity edema but no pitting.   Neurological: She is alert and oriented to person, place, and time.  Skin: Skin is warm and dry.  Psychiatric: She has a normal mood and affect. Her behavior is normal.  Nursing note and vitals reviewed.   ED Course  Procedures (including critical care time) Labs Review Labs Reviewed  CBC WITH DIFFERENTIAL/PLATELET - Abnormal; Notable for the following:    WBC 3.9 (*)    Hemoglobin 9.8 (*)    HCT 33.8 (*)    MCV 75.3 (*)    MCH 21.8 (*)    MCHC 29.0 (*)    RDW 19.4 (*)    Platelets 141 (*)    All other components within normal limits  COMPREHENSIVE METABOLIC PANEL - Abnormal; Notable for the following:    Chloride 95 (*)    BUN 5 (*)    Calcium 8.1 (*)    Total Protein 6.3 (*)    Albumin 2.6 (*)    All other components within normal limits  BRAIN NATRIURETIC PEPTIDE - Abnormal; Notable for the following:    B Natriuretic Peptide 153.5 (*)    All other components within normal limits  CULTURE, BLOOD (ROUTINE X 2)  CULTURE, BLOOD (ROUTINE X 2)  URINALYSIS, ROUTINE W REFLEX MICROSCOPIC (NOT AT [facility])  HIV  ANTIBODY (ROUTINE TESTING)  LEGIONELLA PNEUMOPHILA SEROGP 1 UR AG  STREP PNEUMONIAE URINARY ANTIGEN  BASIC METABOLIC PANEL  CBC WITH DIFFERENTIAL/PLATELET  Randolm Idol, ED    Imaging Review Dg Chest 2 View  09/10/2015   CLINICAL DATA:  Shortness of breath. Physician in rehab facility indicate patient has fluid on left lung. Patient was instructed to go to the ED.  EXAM: CHEST  2 VIEW  COMPARISON:  09/03/2015  FINDINGS: Moderate size left pleural effusion is increasing since previous study. Atelectasis and consolidation in the left lung base. Findings may indicate pneumonia. Right lung is clear and expanded. Left heart border is obscured by the left pulmonary and pleural process but heart size appears mildly enlarged. No  pulmonary vascular congestion. Cardiac valve prosthesis. Mediastinal contours appear intact. Degenerative changes in the spine.  IMPRESSION: Enlarging left pleural effusion with consolidation in the left lung base. Findings may indicate pneumonia.   Electronically Signed   By: Lucienne Capers M.D.   On: 09/10/2015 21:40   I have personally reviewed and evaluated these images and lab results as part of my medical decision-making.   EKG Interpretation   Date/Time:  Monday September 10 2015 19:38:39 EDT Ventricular Rate:  92 PR Interval:  144 QRS Duration: 86 QT Interval:  402 QTC Calculation: 497 R Axis:   45 Text Interpretation:  Sinus rhythm with Premature atrial complexes  Prolonged QT Abnormal ECG Sinus rhythm Premature atrial complexes Artifact  T wave abnormality Abnormal ekg Confirmed by Carmin Muskrat  MD [phone])  on 09/10/2015 9:02:38 PM      MDM   Final diagnoses:  Pleural effusion, left  Hypoxia  Patient presents from cardiac rehabilitation shortness of breath during physical therapy. The therapist noticed that her oxygen level was dropping into the 70s with exertion. She ordered a chest x-ray which showed a large pleural effusion. At rest on room  air the patient is in the high 90s. Her labs are comparable to previous labs. Her chest x-ray shows a left pleural effusion with consolidation. Due to low oxygen saturation with exertion and pleural effusion with recent pneumonia and anabiotic's, she will require admission. I spoke to Dr. Jonnie Finner, hospitalist, who will admit.     Ottie Glazier, PA-C 09/11/15 0121  Carmin Muskrat, MD 09/11/15 [phone]

## 2015-09-11 ENCOUNTER — Inpatient Hospital Stay (HOSPITAL_COMMUNITY): Payer: Medicare Other

## 2015-09-11 ENCOUNTER — Encounter (HOSPITAL_COMMUNITY): Payer: Self-pay | Admitting: *Deleted

## 2015-09-11 DIAGNOSIS — Z9889 Other specified postprocedural states: Secondary | ICD-10-CM

## 2015-09-11 DIAGNOSIS — J948 Other specified pleural conditions: Secondary | ICD-10-CM

## 2015-09-11 DIAGNOSIS — Z8679 Personal history of other diseases of the circulatory system: Secondary | ICD-10-CM

## 2015-09-11 DIAGNOSIS — D469 Myelodysplastic syndrome, unspecified: Secondary | ICD-10-CM

## 2015-09-11 DIAGNOSIS — D61810 Antineoplastic chemotherapy induced pancytopenia: Secondary | ICD-10-CM

## 2015-09-11 DIAGNOSIS — J90 Pleural effusion, not elsewhere classified: Secondary | ICD-10-CM | POA: Diagnosis present

## 2015-09-11 DIAGNOSIS — R0902 Hypoxemia: Secondary | ICD-10-CM

## 2015-09-11 DIAGNOSIS — T451X5A Adverse effect of antineoplastic and immunosuppressive drugs, initial encounter: Secondary | ICD-10-CM

## 2015-09-11 DIAGNOSIS — I10 Essential (primary) hypertension: Secondary | ICD-10-CM

## 2015-09-11 DIAGNOSIS — R0600 Dyspnea, unspecified: Secondary | ICD-10-CM

## 2015-09-11 LAB — BASIC METABOLIC PANEL
ANION GAP: 7 (ref 5–15)
BUN: <5 mg/dL — ABNORMAL LOW (ref 6–20)
CO2: 28 mmol/L (ref 22–32)
Calcium: 8 mg/dL — ABNORMAL LOW (ref 8.9–10.3)
Chloride: 101 mmol/L (ref 101–111)
Creatinine, Ser: 0.6 mg/dL (ref 0.44–1.00)
GLUCOSE: 102 mg/dL — AB (ref 65–99)
POTASSIUM: 4 mmol/L (ref 3.5–5.1)
SODIUM: 136 mmol/L (ref 135–145)

## 2015-09-11 LAB — CBC WITH DIFFERENTIAL/PLATELET
BASOS ABS: 0 10*3/uL (ref 0.0–0.1)
BASOS PCT: 0 %
Eosinophils Absolute: 0 10*3/uL (ref 0.0–0.7)
Eosinophils Relative: 1 %
HEMATOCRIT: 31.1 % — AB (ref 36.0–46.0)
HEMOGLOBIN: 9.2 g/dL — AB (ref 12.0–15.0)
LYMPHS PCT: 40 %
Lymphs Abs: 1.2 10*3/uL (ref 0.7–4.0)
MCH: 22.4 pg — ABNORMAL LOW (ref 26.0–34.0)
MCHC: 29.6 g/dL — ABNORMAL LOW (ref 30.0–36.0)
MCV: 75.7 fL — AB (ref 78.0–100.0)
Monocytes Absolute: 0.1 10*3/uL (ref 0.1–1.0)
Monocytes Relative: 2 %
NEUTROS ABS: 1.8 10*3/uL (ref 1.7–7.7)
NEUTROS PCT: 57 %
Platelets: 114 10*3/uL — ABNORMAL LOW (ref 150–400)
RBC: 4.11 MIL/uL (ref 3.87–5.11)
RDW: 19.4 % — ABNORMAL HIGH (ref 11.5–15.5)
WBC: 3.1 10*3/uL — AB (ref 4.0–10.5)

## 2015-09-11 LAB — BODY FLUID CELL COUNT WITH DIFFERENTIAL
LYMPHS FL: 80 %
MONOCYTE-MACROPHAGE-SEROUS FLUID: 7 % — AB (ref 50–90)
Neutrophil Count, Fluid: 13 % (ref 0–25)
Total Nucleated Cell Count, Fluid: 2936 cu mm — ABNORMAL HIGH (ref 0–1000)

## 2015-09-11 LAB — GRAM STAIN

## 2015-09-11 LAB — LACTATE DEHYDROGENASE, PLEURAL OR PERITONEAL FLUID: LD, Fluid: 85 U/L — ABNORMAL HIGH (ref 3–23)

## 2015-09-11 LAB — HIV ANTIBODY (ROUTINE TESTING W REFLEX): HIV Screen 4th Generation wRfx: NONREACTIVE

## 2015-09-11 LAB — STREP PNEUMONIAE URINARY ANTIGEN: STREP PNEUMO URINARY ANTIGEN: NEGATIVE

## 2015-09-11 MED ORDER — LIDOCAINE HCL (PF) 1 % IJ SOLN
INTRAMUSCULAR | Status: AC
  Filled 2015-09-11: qty 10

## 2015-09-11 NOTE — Evaluation (Signed)
Physical Therapy Evaluation Patient Details Name: Tracy Acosta MRN: [id number] DOB: [birth year] Today's Date: 09/11/2015   History of Present Illness  pt is a 69 y/o femal admitted from SNF (where she was rehabbing back from PNA) due to cough, SOB and low oxygen saturations.  Found to have recurrent PNA.Marland Kitchen Underwent thoracentesis 10/11.  Clinical Impression   Pt can benefit from PT to help her with strengthening and reconditioning to maximize Independence.  She is expected to d/c soon to SNF rehab where she can continue with PT.  Will sign off from acute PT at this time.     Follow Up Recommendations SNF    Equipment Recommendations  None recommended by PT    Recommendations for Other Services       Precautions / Restrictions Precautions Precautions: Fall      Mobility  Bed Mobility Overal bed mobility: Needs Assistance Bed Mobility: Supine to Sit;Sit to Supine     Supine to sit: Min guard Sit to supine: Min guard   General bed mobility comments: some struggle with HOB raised, but no assist  Transfers Overall transfer level: Needs assistance   Transfers: Sit to/from Stand Sit to Stand: Min assist            Ambulation/Gait Ambulation/Gait assistance: Min assist;Min guard Ambulation Distance (Feet): 100 Feet Assistive device: None;1 person hand held assist Gait Pattern/deviations: Step-through pattern;Decreased step length - right;Decreased step length - left   Gait velocity interpretation: Below normal speed for age/gender General Gait Details: Consistently flexed knees and posture; short and slow steps, guarded in nature.  Stairs            Wheelchair Mobility    Modified Rankin (Stroke Patients Only)       Balance Overall balance assessment: Needs assistance Sitting-balance support: No upper extremity supported Sitting balance-Leahy Scale: Fair     Standing balance support: No upper extremity supported Standing balance-Leahy Scale:  Fair                               Pertinent Vitals/Pain Pain Assessment: Faces Faces Pain Scale: Hurts a little bit Pain Location: vague Pain Descriptors / Indicators: Grimacing Pain Intervention(s): Monitored during session    Home Living Family/patient expects to be discharged to:: Skilled nursing facility                      Prior Function Level of Independence: Independent               Hand Dominance        Extremity/Trunk Assessment   Upper Extremity Assessment: Overall WFL for tasks assessed           Lower Extremity Assessment: Overall WFL for tasks assessed;Generalized weakness (quad, proximal weakness)      Cervical / Trunk Assessment: Kyphotic  Communication   Communication: No difficulties  Cognition Arousal/Alertness: Awake/alert Behavior During Therapy: WFL for tasks assessed/performed;Anxious Overall Cognitive Status: Within Functional Limits for tasks assessed                      General Comments      Exercises        Assessment/Plan    PT Assessment All further PT needs can be met in the next venue of care  PT Diagnosis Generalized weakness;Abnormality of gait   PT Problem List Decreased strength;Decreased activity tolerance;Decreased balance;Decreased mobility;Cardiopulmonary status limiting activity  PT Treatment Interventions     PT Goals (Current goals can be found in the Care Plan section) Acute Rehab PT Goals Patient Stated Goal: get more rehab so I can get home PT Goal Formulation: With patient    Frequency     Barriers to discharge        Co-evaluation               End of Session   Activity Tolerance: Patient tolerated treatment well;Patient limited by fatigue Patient left: in bed;with call bell/phone within reach;with family/visitor present Nurse Communication: Mobility status         Time: 6962-9528 PT Time Calculation (min) (ACUTE ONLY): 20 min   Charges:          PT G Codes:        Indira Sorenson, Tessie Fass 09/11/2015, 4:54 PM 09/11/2015  Donnella Sham, PT [phone]  (pager)

## 2015-09-11 NOTE — H&P (Addendum)
[facility] History and Physical  Tracy Acosta JGG:[id number] DOB: 1946-03-01 DOA: 09/10/2015  Referring physician: Everardo Pacific, PA PCP: Josem Kaufmann, MD   Chief Complaint: Cough, SOB, low oxygen level at SNF  HPI: Tracy Acosta is a 69 y.o. female with hx of afib s/p MV repair / Maze procedure in 2013, recently diagnosed with MDS and started on chemoRx per ONC in [place], [place] with azacitidine about 4 months ago, she has had about 4 cycles.  She was admitted to [facility] 10 days ago for PNA, stayed for about a week.  Went to SNF and became SOB while doing cardiac Rehab today.  PT noticed O2 levels dropping. CXR ordered which showed a large L effusion.  She was told to go to ED.  Denies CP. +Cough with some pain with coughing though. +DOE, no orthopnea or PND, no leg edema.    Denies purulent sputum , cough is weak.  No abd pain, no n/v/d.  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  She had MV repair (MV disease runs in family ) and Maze procedure in 2013.  AFter that she says Dr Roxy Manns told me "I don't need to take coumadin anymore".    ROS   no rash  no HA  no blurred vision  no n/v/d   no bloody stool  Where does patient live SNF Can patient participate in ADLs? yes  Past Medical History  Past Medical History  Diagnosis Date  . Atrial fibrillation ([place])     Status post Cox-Maze procedure at the time of mitral valve repair  . Hypothyroidism   . History of pneumonia   . Mitral regurgitation     Status post mitral valve repair   Past Surgical History  Past Surgical History  Procedure Laterality Date  . Arthroscopic left knee surgery    . Mitral valve repair  09/10/2010    Complex valvuloplasty with #63mm ring annuloplasty via right mini thoracotomy  . Cox-maze procedure  09/10/2010   Family History History reviewed. No pertinent family history.  Social History  reports that she has never smoked. She has never used smokeless tobacco. She  reports that she does not drink alcohol or use illicit drugs. Allergies  Allergies  Allergen Reactions  . Codeine Nausea And Vomiting, Palpitations and Other (See Comments)    DIAPHORESIS  . Mirtazapine Other (See Comments)    HYPERACTIVITY   Home medications Prior to Admission medications   Medication Sig Start Date End Date Taking? Authorizing Provider  acetaminophen (TYLENOL) 500 MG tablet Take 1,000 mg by mouth every 6 (six) hours as needed for moderate pain.   Yes Historical Provider, MD  Cholecalciferol 2000 UNITS TABS Take 2,000 Units by mouth daily.   Yes Historical Provider, MD  guaiFENesin (ROBITUSSIN) 100 MG/5ML SOLN Take 15 mLs by mouth every 4 (four) hours as needed for cough or to loosen phlegm.   Yes Historical Provider, MD  levothyroxine (SYNTHROID, LEVOTHROID) 125 MCG tablet Take 1 tablet (125 mcg total) by mouth daily. 04/13/12  Yes Ezra Sites, MD  LORazepam (ATIVAN) 0.5 MG tablet Take 0.5 mg by mouth at bedtime as needed for anxiety or sleep.   Yes Historical Provider, MD  magnesium hydroxide (MILK OF MAGNESIA) 400 MG/5ML suspension Take 30 mLs by mouth daily as needed for moderate constipation.   Yes Historical Provider, MD  ondansetron (ZOFRAN) 4 MG tablet Take 4 mg by mouth as needed for nausea or vomiting (  on days she does her chemo).   Yes Historical Provider, MD  prochlorperazine (COMPAZINE) 10 MG tablet Take 10 mg by mouth daily as needed for nausea or vomiting.  06/27/15  Yes Historical Provider, MD  AzaCITIDine (VIDAZA [place]) Inject into the skin. 5 injections x 5 days in a row per month    Historical Provider, MD  fluticasone (FLONASE) 50 MCG/ACT nasal spray Place 2 sprays into both nostrils daily.    Historical Provider, MD  metoprolol succinate (TOPROL-XL) 50 MG 24 hr tablet Take 1 tablet (50 mg total) by mouth daily. Take with or immediately following a meal. 08/21/15   Satira Sark, MD   Liver Function Tests  Recent Labs Lab 09/10/15 2049  AST 33  ALT  20  ALKPHOS 93  BILITOT 0.7  PROT 6.3*  ALBUMIN 2.6*   No results for input(s): LIPASE, AMYLASE in the last 168 hours. CBC  Recent Labs Lab 09/10/15 2049  WBC 3.9*  NEUTROABS 2.6  HGB 9.8*  HCT 33.8*  MCV 75.3*  PLT 295*   Basic Metabolic Panel  Recent Labs Lab 09/10/15 2049  NA 135  K 4.3  CL 95*  CO2 30  GLUCOSE 98  BUN 5*  CREATININE 0.68  CALCIUM 8.1*    EKG: Independently reviewed > NSR w frequent PAC's  CXR: independently reviewed > R clear, large L effusion/ atx  Filed Vitals:   09/10/15 2245 09/10/15 2300 09/10/15 2330 09/10/15 2345  BP: 141/65 125/71 134/76 129/93  Pulse: 95 39 105 47  Temp:      TempSrc:      Resp:  21 23 31   Height:      Weight:      SpO2: 96% 100% 93% 95%   Exam: Pleasant older adult female, no in distress No rash, cyanosis or gangrene Sclera anicteric, throat clear No jvd, flat neck veins Chest clear on R , +bronchial BS/ dec'd air movement left side 1/2 up Cor is irreg no MRG Abd is soft ntnd no mass or ascites GU deferred Ext no edema, no joint changes Neuro is alert, Ox 3, nonfocal  Na 135 K 4.3  BUN 5 creat 0.68   Ca 8.1   Alb 2.6  LFT's ok WBC 3.9  Hb 9.8  plt 141 UA negative  Assessment: 1. Hypoxemia/ LLL effusion w infiltrate/ cough/ SOB - plan admit, IV abx for HCAP, left lat decub xray in am, O2, nebs. Consider thoracentesis and/or chest CT 2. Myelodysplastic syndrome - f/b ONC in [place].  Will prob need to stop her chemo, second bout of PNA since starting chemo 4 months.  3. Hx MV repair 4. Hx afib, s/p Maze procedure - in NSR , no anticoagulants 5. HTN on MTP 50 xl/ day 6. Pancytopenia - from MDS and possibly also from chemoRx  Plan - as above    DVT Prophylaxis - none, pt worried about bleeding risk from chemo "making my blood too thin", she does not want any blood thinners. Plan SCD's.   Code Status: full  Family Communication: at bedside  Disposition Plan: SNF when better    Sol Blazing [facility] Pager [phone]  Cell [phone]  If 7PM-7AM, please contact night-coverage [email or web address] Password [email or web address] 09/11/2015, 12:02 AM

## 2015-09-11 NOTE — Progress Notes (Signed)
UR Completed Aleksandra Raben Graves-Bigelow, RN,BSN [phone]  

## 2015-09-11 NOTE — Progress Notes (Signed)
Patient seen and examined this morning, admitted overnight.   She has a history of A fib s/p MAZE, MV repair, on no anticoagulation and recent MDS and currently on chemotherapy. She was recently admitted to [facility] with pneumonia and following a 7 day hospital stay she went to SNF for rehab. She has become progressively SOB and hypoxic and was directed for admission.   She is feeling well this morning, NAD, her heart is regular, lung with decreased sounds on left base, no abdominal tenderness.  HCAP - CXR with possible infiltrate and increasing pleural effusion - continue broad spectrum antibiotics - will ask for a thoracentesis, send fluid analysis including cytology.  Acute hypoxic respiratory failure - improving, on room air this morning  A fib s/p MAZE  - she is in sinus - on no anticoagulation  MDS - outpatient treatment  Chemotherapy induced pancytopenia - closely monitor counts, Plt currently > 100K, Hb without transfusion needs and she is not neutropenic  Hypothyroidism - continue synthroid  HTN - continue home medications  S/p MV repair - folllowed with Dr. Domenic Polite just recently, no active issue   Raneisha Bress M. Cruzita Lederer, MD [facility] [phone]

## 2015-09-11 NOTE — Care Management Note (Addendum)
Case Management Note  Patient Details  Name: Tracy Acosta MRN: [id number] Date of Birth: 12-13-1945  Subjective/Objective:     Pt admitted for SOB- pleural effusion. S/p thoracentesis- On IV antibiotics Vancomycin and Zosyn for  PNA.               Action/Plan: Pt is from [facility]. Plan is to return once stable. No further needs from CM at this time. Will continue to monitor.    Expected Discharge Date:  09/12/15               Expected Discharge Plan:  Skilled Nursing Facility  In-House Referral:  Clinical Social Work  Discharge planning Services  CM Consult  Post Acute Care Choice:  NA Choice offered to:  NA  DME Arranged:  N/A DME Agency:  NA  HH Arranged:  NA HH Agency:     Status of Service:  Completed, signed off  Medicare Important Message Given:    Date Medicare IM Given:    Medicare IM give by:    Date Additional Medicare IM Given:    Additional Medicare Important Message give by:     If discussed at [place] of Stay Meetings, dates discussed:    Additional Comments: 1028 09-13-15 Jacqlyn Krauss, RN,BSN [phone] Plan for pt to return to SNF once stable. No further needs from CM at this time.   Bethena Roys, RN 09/11/2015, 1:26 PM

## 2015-09-11 NOTE — Clinical Social Work Note (Signed)
Clinical Social Work Assessment  Patient Details  Name: Tracy Acosta MRN: [id number] Date of Birth: Dec 17, 1945  Date of referral:  09/11/15               Reason for consult:  Facility Placement                Permission sought to share information with:  Facility Sport and exercise psychologist, Family Supports Permission granted to share information::  Yes, Verbal Permission Granted  Name::     Charna Busman  Agency::  [facility]  Relationship::  Daughter  Contact Information:  [phone]  Housing/Transportation Living arrangements for the past 2 months:  [place], [place] of Information:  Patient Patient Interpreter Needed:  None Criminal Activity/Legal Involvement Pertinent to Current Situation/Hospitalization:  No - Comment as needed Significant Relationships:  Adult Children Lives with:  Self Do you feel safe going back to the place where you live?  Yes Need for family participation in patient care:  Yes (Comment)  Care giving concerns:  Patient is aware that she needs rehab before she can return home. Patient states that she is weak and deconditioned from her illness and needs to participate in rehab to regain her strength.    Social Worker assessment / plan:  BSW intern met with patient at bedside to complete assessment. BSW intern introduced self and explained the social workers role in patient care. Patient explained that she had previously been at [facility] and [facility] in [place], [place] to complete short-term rehab after acquiring pneumonia. Patient states that she is happy with the care and Physical Therapy she is receiving at [facility] and hopes to return post DC. Patient also likes that [place] is close to her home. BSW intern asked patient about living arrangement prior to SNF placement and patient stated that she lived by herself. Patient stated that her son lives next door and her daughter lives about five minutes from her  house. Patient states that she has many supports in place and she is very thankful for that. Patient states that once she is discharged, her son or daughter will transport her back to the facility. BSW intern will continue to follow and assist as needed  Employment status:  Retired Forensic scientist:  Medicare PT Recommendations:  [place] / Referral to community resources:  [place]  Patient/Family's Response to care:  Patient is very happy with care that she is receiving at both the hospital and at [facility].   Patient/Family's Understanding of and Emotional Response to Diagnosis, Current Treatment, and Prognosis:  Patient is very knowledgeable about the process of discharge and short-term rehab. Patient is also very understanding on how Medicare works. Patient understands her diagnosis, treatment and prognosis.  Emotional Assessment Appearance:  Appears stated age Attitude/Demeanor/Rapport:  Other ([place] ) Affect (typically observed):  Accepting, Adaptable, Hopeful, Pleasant, Appropriate, Calm Orientation:  Oriented to Self, Oriented to Place, Oriented to  Time, Oriented to Situation Alcohol / Substance use:  Never Used Psych involvement (Current and /or in the community):  No (Comment)  Discharge Needs  Concerns to be addressed:  Discharge Planning Concerns Readmission within the last 30 days:  No Current discharge risk:  None Barriers to Discharge:  Continued Medical Work up   [employer], [id number]

## 2015-09-11 NOTE — Progress Notes (Signed)
Pt has declined IVF at 75/hr as ordered.  Pt states, "I do not want more fluid to go into my lungs, that's why I am here to get the fluid out of my lungs".  Pt also persistent that she needs her 100 mg of metoprolol.  She states she takes twice daily and has only taken once today. [facility] NP on call notified with no new orders received.  Noted pt heart rate below 60 in ED, explained to patient.  Pt currently resting quietly.  Will continue to monitor pt closely.

## 2015-09-11 NOTE — Clinical Social Work Note (Signed)
Patient's information has been faxed to [facility] in [place]. Waiting for PT evaluations for insurance authorization.   Liz Beach MSW, [place], [place], [phone]

## 2015-09-11 NOTE — Procedures (Signed)
Successful US guided left thoracentesis. Yielded 1.4 liters of amber colored fluid. Pt tolerated procedure well. No immediate complications.  Specimen was sent for labs. CXR ordered.  Tsosie Billing D PA-C 09/11/2015 9:57 AM

## 2015-09-12 ENCOUNTER — Inpatient Hospital Stay (HOSPITAL_COMMUNITY): Payer: Medicare Other

## 2015-09-12 DIAGNOSIS — J189 Pneumonia, unspecified organism: Principal | ICD-10-CM

## 2015-09-12 LAB — CBC WITH DIFFERENTIAL/PLATELET
Basophils Absolute: 0 10*3/uL (ref 0.0–0.1)
Basophils Relative: 0 %
EOS ABS: 0 10*3/uL (ref 0.0–0.7)
Eosinophils Relative: 1 %
HEMATOCRIT: 30.1 % — AB (ref 36.0–46.0)
HEMOGLOBIN: 9.2 g/dL — AB (ref 12.0–15.0)
LYMPHS ABS: 1.1 10*3/uL (ref 0.7–4.0)
LYMPHS PCT: 38 %
MCH: 22.8 pg — AB (ref 26.0–34.0)
MCHC: 30.6 g/dL (ref 30.0–36.0)
MCV: 74.7 fL — AB (ref 78.0–100.0)
MONOS PCT: 1 %
Monocytes Absolute: 0 10*3/uL — ABNORMAL LOW (ref 0.1–1.0)
NEUTROS ABS: 1.7 10*3/uL (ref 1.7–7.7)
NEUTROS PCT: 60 %
Platelets: 112 10*3/uL — ABNORMAL LOW (ref 150–400)
RBC: 4.03 MIL/uL (ref 3.87–5.11)
RDW: 19.5 % — ABNORMAL HIGH (ref 11.5–15.5)
WBC: 2.8 10*3/uL — AB (ref 4.0–10.5)

## 2015-09-12 LAB — COMPREHENSIVE METABOLIC PANEL
ALK PHOS: 79 U/L (ref 38–126)
ALT: 20 U/L (ref 14–54)
ANION GAP: 6 (ref 5–15)
AST: 22 U/L (ref 15–41)
Albumin: 2.2 g/dL — ABNORMAL LOW (ref 3.5–5.0)
BILIRUBIN TOTAL: 0.7 mg/dL (ref 0.3–1.2)
BUN: 6 mg/dL (ref 6–20)
CALCIUM: 7.8 mg/dL — AB (ref 8.9–10.3)
CO2: 31 mmol/L (ref 22–32)
Chloride: 98 mmol/L — ABNORMAL LOW (ref 101–111)
Creatinine, Ser: 0.81 mg/dL (ref 0.44–1.00)
GFR calc non Af Amer: >60 mL/min (ref >60)
Glucose, Bld: 104 mg/dL — ABNORMAL HIGH (ref 65–99)
Potassium: 4.4 mmol/L (ref 3.5–5.1)
Sodium: 135 mmol/L (ref 135–145)
TOTAL PROTEIN: 5.1 g/dL — AB (ref 6.5–8.1)

## 2015-09-12 LAB — PH, BODY FLUID: pH, Body Fluid: 7.6

## 2015-09-12 LAB — LEGIONELLA PNEUMOPHILA SEROGP 1 UR AG: L. pneumophila Serogp 1 Ur Ag: NEGATIVE

## 2015-09-12 MED ORDER — LEVOFLOXACIN 500 MG PO TABS
500.0000 mg | ORAL_TABLET | Freq: Every day | ORAL | Status: DC
  Administered 2015-09-12 – 2015-09-13 (×2): 500 mg via ORAL
  Filled 2015-09-12 (×2): qty 1

## 2015-09-12 MED ORDER — FUROSEMIDE 20 MG PO TABS
20.0000 mg | ORAL_TABLET | Freq: Every day | ORAL | Status: DC
  Administered 2015-09-13: 20 mg via ORAL
  Filled 2015-09-12: qty 1

## 2015-09-12 NOTE — Clinical Social Work Note (Signed)
Authorization received from [facility] [id number]. Next review date is 10/17. Authorization is valid for 48hrs. Auth information has been given to [employer] at [facility].  Liz Beach MSW, [place], [place], [phone]

## 2015-09-12 NOTE — Evaluation (Signed)
  Occupational Therapy Evaluation Patient Details Name: Tracy Acosta MRN: [id number] DOB: 06-28-1946 Today's Date: Sep 25, 2015    History of Present Illness pt is a 69 y/o femal admitted from SNF (where she was rehabbing back from PNA) due to cough, SOB and low oxygen saturations.  Found to have recurrent PNA.Marland Kitchen Underwent thoracentesis 10/11.   Clinical Impression   Pt agreeable to OT at SNF in order to obtain I and return home    Follow Up Recommendations  SNF    Equipment Recommendations  None recommended by OT    Recommendations for Other Services       Precautions / Restrictions Precautions Precautions: Fall      Mobility Bed Mobility Overal bed mobility: Needs Assistance Bed Mobility: Supine to Sit;Sit to Supine     Supine to sit: Min guard Sit to supine: Min guard   General bed mobility comments: some struggle with HOB raised, but no assist  Transfers Overall transfer level: Needs assistance Equipment used: Rolling walker (2 wheeled) Transfers: Sit to/from Stand Sit to Stand: Min assist;Mod assist                   ADL Overall ADL's : Needs assistance/impaired     Grooming: Sitting;Set up   Upper Body Bathing: Set up;Sitting   Lower Body Bathing: Moderate assistance;Sit to/from stand   Upper Body Dressing : Set up;Sitting   Lower Body Dressing: Moderate assistance;Sit to/from stand                                 Pertinent Vitals/Pain Pain Assessment: Faces Faces Pain Scale: No hurt Pain Intervention(s): Monitored during session        Extremity/Trunk Assessment Upper Extremity Assessment Upper Extremity Assessment: Generalized weakness           Communication Communication Communication: No difficulties   Cognition Arousal/Alertness: Awake/alert Behavior During Therapy: WFL for tasks assessed/performed;Anxious Overall Cognitive Status: Within Functional Limits for tasks assessed                                 Home Living Family/patient expects to be discharged to:: Skilled nursing facility                                        Prior Functioning/Environment Level of Independence: Independent             OT Diagnosis: Generalized weakness         OT Goals(Current goals can be found in the care plan section) Acute Rehab OT Goals Patient Stated Goal: get more rehab so I can get home OT Goal Formulation: With patient  OT Frequency:                End of Session Nurse Communication: Mobility status  Activity Tolerance: Patient tolerated treatment well Patient left: in bed;with call bell/phone within reach   Time: 1255-1308 OT Time Calculation (min): 13 min Charges:  OT General Charges $OT Visit: 1 Procedure OT Evaluation $Initial OT Evaluation Tier I: 1 Procedure G-Codes:    Payton Mccallum D 09-25-2015, 1:19 PM

## 2015-09-12 NOTE — Progress Notes (Signed)
Tracy Acosta:[id number] DOB: 09/23/1946 DOA: 09/10/2015 PCP: Josem Kaufmann, MD  Brief  narrative: 69 y/o ?  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 Gaynelle Arabian in [place] since 2016    Admitted to [facility] follow 10 day hosptial stay [facility] for PNA ? sob, dyspnea-found large L pleural effusion Found to have low WBC ~ 3.9, Low PLT 141   thsi was subsequently tapped 1.4 liter son 09/11/15  Past medical history-As per Problem list Chart reviewed as below-   Consultants:  none  Procedures:  none  Antibiotics:  Vanc  Zosyn  Levaquin 10/12   Subjective  Feels much better after Thora Has had some lower extremity swelling as well for the past couple of days at the nursing facility that she was discharged  tolerating a diet well without issue  has a litany of complaints non-related to current issue   Obj      Interim History:   Telemetry: nsr   Objective: Filed Vitals:   09/11/15 0954 09/11/15 1300 09/11/15 2007 09/12/15 0517  BP: 123/67 107/51 108/77 118/54  Pulse:  87 82 74  Temp:  98.4 F (36.9 C) 98.4 F (36.9 C) 97.6 F (36.4 C)  TempSrc:  Oral Oral Oral  Resp:  17 18 18   Height:      Weight:    70.1 kg (154 lb 8.7 oz)  SpO2:  94% 97% 100%    Intake/Output Summary (Last 24 hours) at 09/12/15 0851 Last data filed at 09/12/15 0328  Gross per 24 hour  Intake    120 ml  Output   1450 ml  Net  -1330 ml    Exam:  General: eomi ncat Cardiovascular: s1 s 2no m/r/g Respiratory: dullness to post l lung filed Abdomen: soft nt nd Skin no le edema Neuro intact  Data Reviewed: Basic Metabolic Panel:  Recent Labs Lab 09/10/15 2049 09/11/15 0055 09/12/15 0513  NA 135 136 135  K 4.3 4.0 4.4  CL 95* 101 98*  CO2 30 28 31   GLUCOSE 98 102* 104*  BUN 5* <5* 6  CREATININE 0.68 0.60 0.81  CALCIUM 8.1* 8.0* 7.8*   Liver Function Tests:  Recent Labs Lab 09/10/15 2049  09/12/15 0513  AST 33 22  ALT 20 20  ALKPHOS 93 79  BILITOT 0.7 0.7  PROT 6.3* 5.1*  ALBUMIN 2.6* 2.2*   No results for input(s): LIPASE, AMYLASE in the last 168 hours. No results for input(s): AMMONIA in the last 168 hours. CBC:  Recent Labs Lab 09/10/15 2049 09/11/15 0055 09/12/15 0513  WBC 3.9* 3.1* 2.8*  NEUTROABS 2.6 1.8 1.7  HGB 9.8* 9.2* 9.2*  HCT 33.8* 31.1* 30.1*  MCV 75.3* 75.7* 74.7*  PLT 141* 114* 112*   Cardiac Enzymes: No results for input(s): CKTOTAL, CKMB, CKMBINDEX, TROPONINI in the last 168 hours. BNP: Invalid input(s): POCBNP CBG: No results for input(s): GLUCAP in the last 168 hours.  Recent Results (from the past 240 hour(s))  Culture, body fluid-bottle     Status: None (Preliminary result)   Collection Time: 09/11/15  9:49 AM  Result Value Ref Range Status   Specimen Description FLUID LEFT PLEURAL  Final   Special Requests BAA 10CCS  Final   Culture PENDING  Incomplete   Report Status PENDING  Incomplete  Gram stain     Status: None   Collection Time: 09/11/15  9:49 AM  Result Value Ref  Range Status   Specimen Description FLUID LEFT PLEURAL  Final   Special Requests NONE  Final   Gram Stain   Final    ABUNDANT WBC PRESENT,BOTH PMN AND MONONUCLEAR NO ORGANISMS SEEN    Report Status 09/11/2015 FINAL  Final     Studies:              All Imaging reviewed and is as per above notation   Scheduled Meds: . cholecalciferol  2,000 Units Oral Daily  . levothyroxine  125 mcg Oral QAC breakfast  . metoprolol succinate  50 mg Oral Daily  . piperacillin-tazobactam (ZOSYN)  IV  3.375 g Intravenous Q8H  . vancomycin  1,000 mg Intravenous Q12H   Continuous Infusions: . 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

## 2015-09-12 NOTE — Progress Notes (Signed)
Pt states that she did not want to take her lasix tonight because she does not want to have to get up to urinate all night. Lasix was documented not given by day shift RN. Pt states that she would like to take her lasix in the morning.

## 2015-09-13 ENCOUNTER — Encounter: Payer: Medicare Other | Admitting: Adult Health

## 2015-09-13 LAB — COMPREHENSIVE METABOLIC PANEL
ALT: 20 U/L (ref 14–54)
AST: 20 U/L (ref 15–41)
Albumin: 2.2 g/dL — ABNORMAL LOW (ref 3.5–5.0)
Alkaline Phosphatase: 76 U/L (ref 38–126)
Anion gap: 12 (ref 5–15)
BILIRUBIN TOTAL: 0.6 mg/dL (ref 0.3–1.2)
BUN: 7 mg/dL (ref 6–20)
CALCIUM: 8.5 mg/dL — AB (ref 8.9–10.3)
CO2: 29 mmol/L (ref 22–32)
CREATININE: 0.83 mg/dL (ref 0.44–1.00)
Chloride: 100 mmol/L — ABNORMAL LOW (ref 101–111)
GFR calc Af Amer: >60 mL/min (ref >60)
Glucose, Bld: 94 mg/dL (ref 65–99)
POTASSIUM: 4.2 mmol/L (ref 3.5–5.1)
Sodium: 141 mmol/L (ref 135–145)
TOTAL PROTEIN: 5.8 g/dL — AB (ref 6.5–8.1)

## 2015-09-13 LAB — CBC
HEMATOCRIT: 32.6 % — AB (ref 36.0–46.0)
Hemoglobin: 9.6 g/dL — ABNORMAL LOW (ref 12.0–15.0)
MCH: 22.1 pg — ABNORMAL LOW (ref 26.0–34.0)
MCHC: 29.4 g/dL — ABNORMAL LOW (ref 30.0–36.0)
MCV: 75.1 fL — AB (ref 78.0–100.0)
Platelets: 122 10*3/uL — ABNORMAL LOW (ref 150–400)
RBC: 4.34 MIL/uL (ref 3.87–5.11)
RDW: 19.5 % — AB (ref 11.5–15.5)
WBC: 2.2 10*3/uL — AB (ref 4.0–10.5)

## 2015-09-13 MED ORDER — FUROSEMIDE 20 MG PO TABS: 20.0000 mg | ORAL_TABLET | ORAL | Status: AC

## 2015-09-13 MED ORDER — LEVOFLOXACIN 500 MG PO TABS: 500.0000 mg | ORAL_TABLET | Freq: Every day | ORAL | Status: AC

## 2015-09-13 NOTE — Discharge Summary (Signed)
Physician Discharge Summary  Tracy Acosta GMW:[id number] DOB: 1946-01-18 DOA: 09/10/2015  PCP: Josem Kaufmann, MD  Admit date: 09/10/2015 Discharge date: 09/13/2015  Time spent: 35 minutes  Recommendations for Outpatient Follow-up:  1. Patient will need 5 more days of Levaquin 500 by mouth until 10/18 2. Patient will need every other day Lasix 20 mg to be determined going forward in the next 2 weeks after chest x-ray in 10 days if further Lasix as needed based on comparison of pleural effusion on the left side. This can probably be discontinued if the effusion has resolved 3. Get basic metabolic panel , CBC in 1 week 4. Patient's Azactidine for MDS has been held until treatment of pneumonia has resolved-would not restart until treatment has been completed and would schedule follow-up with Dr.Gai of oncology  Discharge Diagnoses:  Principal Problem:   HCAP (healthcare-associated pneumonia) Active Problems:   HYPERTENSION, BENIGN ESSENTIAL   S/P MVR (mitral valve repair)   S/P Maze operation for atrial fibrillation   Dyspnea   MDS (myelodysplastic syndrome) (HCC)   Hypoxia   Pleural effusion, left   Antineoplastic chemotherapy induced pancytopenia ([place])   Discharge Condition: Fair  Diet recommendation: Heart healthy  Filed Weights   09/11/15 0516 09/12/15 0517 09/13/15 0605  Weight: 70 kg (154 lb 5.2 oz) 70.1 kg (154 lb 8.7 oz) 69.355 kg (152 lb 14.4 oz)    History of present illness:  69 y/o ?  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 Gaynelle Arabian in [place] since 2016    Admitted to [facility] follow 10 day hosptial stay [facility] for PNA ? sob, dyspnea-found large L pleural effusion Found to have low WBC ~ 3.9, Low PLT 141   thsi was subsequently tapped 1.4 liter son 09/11/15  Hospital Course:  Possible parapneumonic effusion -Narrowed from vancomycin and Zosyn to by mouth Levaquin on 10/13 -Continue Levaquin  on 09/17/70 -Because labs were not drawn appropriately to determine whether this is exudative or transudative do not feel this is heart failure :white count was elevated which points to may be an exudative effusion -setting of atrial fibrillation and ankle swelling I do think it is reasonable to start on Lasix 20 mg daily while in hospital and then every other day as an outpatient 20 mg -Going forward would recommend outpatient chest x-ray to determine if there is further effusion and if there is would keep on by mouth Lasix -Would consider repeat thoracocentesis if patient feels more short of breath or sats drop-  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 -Needs follow-up with oncologist as an outpatient -Can continue Azacitadine once d/c from hosptial and completed antibiotics  and sees Oncologist once infection resolves -Recommend outpatient follow-up closely in 1-2 weeks  Atrial fibrillation status post maze procedure, Chad2Score>3 -Continue metoprolol XL 50 daily Not on any prior to admission anticoagulation  Hypothyroid -continue Levothyrpoxine 125 daily -TSH 1 mo  S/p MV repair -stable    Discharge Exam: Filed Vitals:   09/13/15 0605  BP: 112/69  Pulse: 96  Temp: 97.8 F (36.6 C)  Resp: 18    General: EOMI NCAT Clinically clear no added sound no rales no rhonchi   S1-S2 no murmur rub or gallop telemetry is benign  Discharge Instructions   Discharge Instructions    Diet - low sodium heart healthy    Complete by:  As directed      Increase activity slowly  Complete by:  As directed           Current Discharge Medication List    START taking these medications   Details  furosemide (LASIX) 20 MG tablet Take 1 tablet (20 mg total) by mouth every other day. Qty: 30 tablet    levofloxacin (LEVAQUIN) 500 MG tablet Take 1 tablet (500 mg total) by mouth daily. Qty: 5 tablet, Refills: 0      CONTINUE these medications which have NOT CHANGED    Details  acetaminophen (TYLENOL) 500 MG tablet Take 1,000 mg by mouth every 6 (six) hours as needed for moderate pain.    Cholecalciferol 2000 UNITS TABS Take 2,000 Units by mouth daily.    guaiFENesin (ROBITUSSIN) 100 MG/5ML SOLN Take 15 mLs by mouth every 4 (four) hours as needed for cough or to loosen phlegm.    levothyroxine (SYNTHROID, LEVOTHROID) 125 MCG tablet Take 1 tablet (125 mcg total) by mouth daily. Qty: 90 tablet, Refills: 0    LORazepam (ATIVAN) 0.5 MG tablet Take 0.5 mg by mouth at bedtime as needed for anxiety or sleep.    magnesium hydroxide (MILK OF MAGNESIA) 400 MG/5ML suspension Take 30 mLs by mouth daily as needed for moderate constipation.    ondansetron (ZOFRAN) 4 MG tablet Take 4 mg by mouth as needed for nausea or vomiting (on days she does her chemo).    prochlorperazine (COMPAZINE) 10 MG tablet Take 10 mg by mouth daily as needed for nausea or vomiting.  Refills: 0    fluticasone (FLONASE) 50 MCG/ACT nasal spray Place 2 sprays into both nostrils daily.    metoprolol succinate (TOPROL-XL) 50 MG 24 hr tablet Take 1 tablet (50 mg total) by mouth daily. Take with or immediately following a meal. Qty: 90 tablet, Refills: 3      STOP taking these medications     AzaCITIDine (VIDAZA [place])        Allergies  Allergen Reactions  . Codeine Nausea And Vomiting, Palpitations and Other (See Comments)    DIAPHORESIS  . Mirtazapine Other (See Comments)    HYPERACTIVITY      The results of significant diagnostics from this hospitalization (including imaging, microbiology, ancillary and laboratory) are listed below for reference.    Significant Diagnostic Studies: Dg Chest 1 View  09/11/2015  CLINICAL DATA:  Post left thoracentesis. EXAM: CHEST 1 VIEW COMPARISON:  09/10/2015 FINDINGS: Interval decrease in left pleural effusion. Mild to moderate left effusion remains with compressive atelectasis in the left lower lobe. No pneumothorax. Right lung remains clear.  IMPRESSION: No pneumothorax post left thoracentesis. Electronically Signed   By: Franchot Gallo M.D.   On: 09/11/2015 10:36   Dg Chest 2 View  09/12/2015  CLINICAL DATA:  Dyspnea and shortness of Breath EXAM: CHEST - 2 VIEW COMPARISON:  09/11/2015 FINDINGS: Cardiac shadow is stable. Postsurgical changes are again seen. Persistent left basilar infiltrate is noted with small residual effusion. No pneumothorax is noted. The right lung is stable. IMPRESSION: Persistent left basilar changes with small effusion. No acute abnormality is noted. Electronically Signed   By: Inez Catalina M.D.   On: 09/12/2015 11:25   Dg Chest 2 View  09/10/2015  CLINICAL DATA:  Shortness of breath. Physician in rehab facility indicate patient has fluid on left lung. Patient was instructed to go to the ED. EXAM: CHEST  2 VIEW COMPARISON:  09/03/2015 FINDINGS: Moderate size left pleural effusion is increasing since previous study. Atelectasis and consolidation in the left lung base. Findings  may indicate pneumonia. Right lung is clear and expanded. Left heart border is obscured by the left pulmonary and pleural process but heart size appears mildly enlarged. No pulmonary vascular congestion. Cardiac valve prosthesis. Mediastinal contours appear intact. Degenerative changes in the spine. IMPRESSION: Enlarging left pleural effusion with consolidation in the left lung base. Findings may indicate pneumonia. Electronically Signed   By: Lucienne Capers M.D.   On: 09/10/2015 21:40   US Thoracentesis Asp Pleural Space W/img Guide  09/11/2015  INDICATION: Symptomatic left sided pleural effusion EXAM: US THORACENTESIS ASP PLEURAL SPACE W/IMG GUIDE COMPARISON:  CXR 09/10/2015. MEDICATIONS: None COMPLICATIONS: None immediate TECHNIQUE: Informed written consent was obtained from the patient after a discussion of the risks, benefits and alternatives to treatment. A timeout was performed prior to the initiation of the procedure. Initial ultrasound  scanning demonstrates a left pleural effusion. The lower chest was prepped and draped in the usual sterile fashion. 1% lidocaine was used for local anesthesia. An ultrasound image was saved for documentation purposes. A 6 Fr Safe-T-Centesis catheter was introduced. The thoracentesis was performed. The catheter was removed and a dressing was applied. The patient tolerated the procedure well without immediate post procedural complication. The patient was escorted to have an upright chest radiograph. FINDINGS: A total of approximately 1.4 liters of amber colored fluid was removed. Requested samples were sent to the laboratory. IMPRESSION: Successful ultrasound-guided left sided thoracentesis yielding 1.4 liters of pleural fluid. Read By:  Tsosie Billing PA-C Electronically Signed   By: Aletta Edouard M.D.   On: 09/11/2015 10:11    Microbiology: Recent Results (from the past 240 hour(s))  Culture, blood (routine x 2) Call MD if unable to obtain prior to antibiotics being given     Status: None (Preliminary result)   Collection Time: 09/11/15 12:55 AM  Result Value Ref Range Status   Specimen Description BLOOD RIGHT ANTECUBITAL  Final   Special Requests BOTTLES DRAWN AEROBIC AND ANAEROBIC 5CC  Final   Culture NO GROWTH 1 DAY  Final   Report Status PENDING  Incomplete  Culture, blood (routine x 2) Call MD if unable to obtain prior to antibiotics being given     Status: None (Preliminary result)   Collection Time: 09/11/15  1:05 AM  Result Value Ref Range Status   Specimen Description BLOOD LEFT ANTECUBITAL  Final   Special Requests IN PEDIATRIC BOTTLE 4CC  Final   Culture NO GROWTH 1 DAY  Final   Report Status PENDING  Incomplete  Culture, body fluid-bottle     Status: None (Preliminary result)   Collection Time: 09/11/15  9:49 AM  Result Value Ref Range Status   Specimen Description FLUID LEFT PLEURAL  Final   Special Requests BAA 10CCS  Final   Culture NO GROWTH 1 DAY  Final   Report Status  PENDING  Incomplete  Gram stain     Status: None   Collection Time: 09/11/15  9:49 AM  Result Value Ref Range Status   Specimen Description FLUID LEFT PLEURAL  Final   Special Requests NONE  Final   Gram Stain   Final    ABUNDANT WBC PRESENT,BOTH PMN AND MONONUCLEAR NO ORGANISMS SEEN    Report Status 09/11/2015 FINAL  Final     Labs: Basic Metabolic Panel:  Recent Labs Lab 09/10/15 2049 09/11/15 0055 09/12/15 0513 09/13/15 0441  NA 135 136 135 141  K 4.3 4.0 4.4 4.2  CL 95* 101 98* 100*  CO2 30 28 31  29  GLUCOSE 98 102* 104* 94  BUN 5* <5* 6 7  CREATININE 0.68 0.60 0.81 0.83  CALCIUM 8.1* 8.0* 7.8* 8.5*   Liver Function Tests:  Recent Labs Lab 09/10/15 2049 09/12/15 0513 09/13/15 0441  AST 33 22 20  ALT 20 20 20   ALKPHOS 93 79 76  BILITOT 0.7 0.7 0.6  PROT 6.3* 5.1* 5.8*  ALBUMIN 2.6* 2.2* 2.2*   No results for input(s): LIPASE, AMYLASE in the last 168 hours. No results for input(s): AMMONIA in the last 168 hours. CBC:  Recent Labs Lab 09/10/15 2049 09/11/15 0055 09/12/15 0513 09/13/15 0441  WBC 3.9* 3.1* 2.8* 2.2*  NEUTROABS 2.6 1.8 1.7  --   HGB 9.8* 9.2* 9.2* 9.6*  HCT 33.8* 31.1* 30.1* 32.6*  MCV 75.3* 75.7* 74.7* 75.1*  PLT 141* 114* 112* 122*   Cardiac Enzymes: No results for input(s): CKTOTAL, CKMB, CKMBINDEX, TROPONINI in the last 168 hours. BNP: BNP (last 3 results)  Recent Labs  09/10/15 2050  BNP 153.5*    ProBNP (last 3 results) No results for input(s): PROBNP in the last 8760 hours.  CBG: No results for input(s): GLUCAP in the last 168 hours.     SignedNita Sells  [facility] 09/13/2015, 9:42 AM

## 2015-09-13 NOTE — Care Management Important Message (Signed)
Important Message  Patient Details  Name: Tracy Acosta MRN: [id number] Date of Birth: [birth year]   Medicare Important Message Given:  Yes-second notification given    Nathen May 09/13/2015, 10:50 AM

## 2015-09-13 NOTE — Progress Notes (Addendum)
Assumed care from off going RN ; patient alert & oriented times four; patient has no c/o's ; denies pain; HOB elevated; bed in lowest position; call bell in reach; possible discharge later today  Patient being discharge; no c/o's at this time; denies pain; daughter in room;

## 2015-09-13 NOTE — Clinical Social Work Note (Signed)
Clinical Social Worker facilitated patient discharge including contacting patient family and facility to confirm patient discharge plans.  Clinical information faxed to facility and family agreeable with plan.  [employer] authorization received [phone])  CSW arranged transport with patient daughter to [facility].  RN to call report prior to discharge.  Clinical Social Worker will sign off for now as social work intervention is no longer needed. Please consult [place] again if new need arises.  Barbette Or, [place]

## 2015-09-13 NOTE — Progress Notes (Signed)
Cardiology Office Note   ERROR CANCELLED APPT

## 2015-09-16 LAB — CULTURE, BLOOD (ROUTINE X 2)
CULTURE: NO GROWTH
Culture: NO GROWTH

## 2015-09-16 LAB — CULTURE, BODY FLUID W GRAM STAIN -BOTTLE: Culture: NO GROWTH

## 2015-09-16 LAB — CULTURE, BODY FLUID-BOTTLE

## 2015-09-26 ENCOUNTER — Encounter: Payer: Self-pay | Admitting: *Deleted

## 2015-10-01 ENCOUNTER — Ambulatory Visit (INDEPENDENT_AMBULATORY_CARE_PROVIDER_SITE_OTHER): Payer: Medicare Other | Admitting: Cardiology

## 2015-10-01 ENCOUNTER — Encounter: Payer: Self-pay | Admitting: Cardiology

## 2015-10-01 VITALS — BP 125/83 | HR 77 | Ht 68.0 in | Wt 149.4 lb

## 2015-10-01 DIAGNOSIS — D469 Myelodysplastic syndrome, unspecified: Secondary | ICD-10-CM

## 2015-10-01 DIAGNOSIS — I480 Paroxysmal atrial fibrillation: Secondary | ICD-10-CM

## 2015-10-01 DIAGNOSIS — Z9889 Other specified postprocedural states: Secondary | ICD-10-CM

## 2015-10-01 NOTE — Patient Instructions (Signed)
Your physician recommends that you continue on your current medications as directed. Please refer to the Current Medication list given to you today.  Your physician recommends that you schedule a follow-up appointment in: 6 weeks  

## 2015-10-01 NOTE — Progress Notes (Signed)
Cardiology Office Note  Date: 10/01/2015   ID: Tracy Acosta, DOB 12-21-1945, MRN [id number]  PCP: Tracy Kaufmann, MD  Primary Cardiologist: Tracy Lesches, MD   Chief Complaint  Patient presents with  . Hospitalization Follow-up    History of Present Illness: Tracy Acosta is a 69 y.o. female last seen in September. She presents for hospital follow-up, recently discharged from [facility] on October 13 following a prolonged treatment course for pneumonia. She had initially presented to [facility] and found to have a large left pleural effusion (possibly parapneumonic). She required thoracentesis in addition to antibiotic treatment. It looks like she was also diagnosed with recurrent atrial fibrillation.  She is here today with her daughter. They brought in records from [place]. She continues planned follow-up with Dr. Gaynelle Acosta in [place] for management of myelodysplastic syndrome. She is not currently on chemotherapy, and reportedly the plan is to consider different treatment course in the next month. She has done rehabilitation, originally in a nursing facility, now planned to do additional outpatient treatments. States that she is feeling better in terms of energy, no active cough, no fevers or chills.  Tracing from 09/10/2015 showed sinus rhythm with PACs. I reviewed the available tracings from her stay at [facility], and rhythm was sinus with frequent PACs, not atrial fibrillation. Echocardiogram done at [facility] on October 3 indicated mild to moderate LVH with LVEF greater than 65%, no major valvular abnormalities. I did locate a single ECG from October 2 at [facility] that showed rapid atrial fibrillation, and presumably she had paroxysmal arrhythmia.  We discussed her medications. She is on low-dose Lasix as well as Toprol-XL at 50 mg daily. She had recent lab work which is outlined below, continues to be pancytopenic. Her CHADSVASC score is 2-3. We discussed the possibility  that anticoagulation would be reconsidered since she has had recurrent atrial fibrillation, although with active pancytopenia and considerations for a different type of chemotherapy agent, we both decided that now would not be the time to resume anticoagulation.   Past Medical History  Diagnosis Date  . 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 Hypothyroidism   . History of pneumonia   . Mitral regurgitation     Status post mitral valve repair    Past Surgical History  Procedure Laterality Date  . Arthroscopic left knee surgery    . Mitral valve repair  09/10/2010    Complex valvuloplasty with #35mm ring annuloplasty via right mini thoracotomy  . Cox-maze procedure  09/10/2010    Current Outpatient Prescriptions  Medication Sig Dispense Refill  . Cholecalciferol 2000 UNITS TABS Take 2,000 Units by mouth daily.    . fluticasone (FLONASE) 50 MCG/ACT nasal spray Place 2 sprays into both nostrils daily.    . furosemide (LASIX) 20 MG tablet Take 1 tablet (20 mg total) by mouth every other day. 30 tablet   . levofloxacin (LEVAQUIN) 500 MG tablet Take 1 tablet (500 mg total) by mouth daily. 5 tablet 0  . levothyroxine (SYNTHROID, LEVOTHROID) 125 MCG tablet Take 1 tablet (125 mcg total) by mouth daily. 90 tablet 0  . LORazepam (ATIVAN) 0.5 MG tablet Take 0.5 mg by mouth at bedtime as needed for anxiety or sleep.    . magnesium hydroxide (MILK OF MAGNESIA) 400 MG/5ML suspension Take 30 mLs by mouth daily as needed for moderate constipation.    . metoprolol succinate (TOPROL-XL) 50 MG 24  hr tablet Take 1 tablet (50 mg total) by mouth daily. Take with or immediately following a meal. 90 tablet 3  . ondansetron (ZOFRAN) 4 MG tablet Take 4 mg by mouth as needed for nausea or vomiting (on days she does her chemo).    . prochlorperazine (COMPAZINE) 10 MG tablet Take 10 mg by mouth daily as needed for nausea or vomiting.   0   No current  facility-administered medications for this visit.    Allergies:  Codeine and Mirtazapine   Social History: The patient  reports that she has never smoked. She has never used smokeless tobacco. She reports that she does not drink alcohol or use illicit drugs.   ROS:  Please see the history of present illness. Otherwise, complete review of systems is positive for decreased appetite with some dysphasia - last try Nexium.  All other systems are reviewed and negative.   Physical Exam: VS:  BP 125/83 mmHg  Pulse 77  Ht 5\' 8"  (1.727 m)  Wt 149 lb 6.4 oz (67.767 kg)  BMI 22.72 kg/m2  SpO2 100%, BMI Body mass index is 22.72 kg/(m^2).  Wt Readings from Last 3 Encounters:  10/01/15 149 lb 6.4 oz (67.767 kg)  09/13/15 152 lb 14.4 oz (69.355 kg)  08/21/15 154 lb 1.9 oz (69.908 kg)     Tall woman, appears comfortable at rest.  HEENT: Conjunctiva with some injection, lids normal, oropharynx clear.  Neck: Supple, no elevated JVP or carotid bruits, no thyromegaly.  Lungs: Clear to auscultation, nonlabored breathing at rest.  Cardiac: Largely regular with occasional ectopic beats, no S3, soft systolic murmur, no pericardial rub.  Abdomen: Soft, nontender, bowel sounds present.  Extremities: No pitting edema, distal pulses normal including radials and dorsalis pedis bilaterally. Skin: Warm and dry. Musculoskeletal: No kyphosis. Neuropsychiatric: Alert and oriented 3, affect appropriate.   ECG: ECG is not ordered today.   Recent Labwork: 09/10/2015: B Natriuretic Peptide 153.5* 09/13/2015: ALT 20; AST 20; BUN 7; Creatinine, Ser 0.83; Hemoglobin 9.6*; Platelets 122*; Potassium 4.2; Sodium 141  09/24/2015: WBC 2.1, Hgb 10.2, platelets 100  Other Studies Reviewed Today:  Echocardiogram 02/01/2015: Study Conclusions  - Left ventricle: Upper septal thickening. No LVOT gradient. The cavity size was normal. The estimated ejection fraction was 60%. Wall motion was normal; there were  no regional wall motion abnormalities. - Mitral valve: The mitral valve repair is intact. There is no significant MR or MS. - Left atrium: The atrium was mildly dilated. - Right ventricle: Not able to assess RV function due to poor visualization. Not able to assess RV size due to poor visualization.  Abdominal ultrasound 02/08/2015: Normal caliber abdominal aorta and proximal common iliac arteries without evidence of focal dilatation. Aortoiliac atherosclerosis without stenosis. Patent IVC.  Lower extremity arterial Dopplers 02/09/2015: Normal ABIs bilaterally with normal great toe brachial indices bilaterally.  Echocardiogram 09/03/2015 [facility]) reported mild to moderate concentric LVH with LVEF greater than 16%, diastolic dysfunction, Mac without significant mitral regurgitation, mildly sclerotic aortic valve, no pericardial effusion.  Assessment and Plan:  1. Paroxysmal atrial fibrillation, recently documented in the setting of pneumonia with parapneumonic effusion. Most recently she has been in sinus rhythm with PACs. Although CHADSVASC score is 2-3, she has myelodysplastic syndrome with pancytopenia, ongoing management per Dr. Gaynelle Acosta with plan to adjust chemotherapeutic agents over the next month. The patient and I both agreed that now would not be the time to initiate anticoagulation until we better understand the course of her pancytopenia.  2.  Possible component of diastolic heart failure during recent hospital stay, although the effusion was possibly parapneumonic and exudative. She continues on low-dose Lasix.  3. Status post mitral valve annuloplasty and repair in 2011, stable by recent follow-up echocardiogram.  4. Myelodysplastic syndrome, followed and managed by Dr. Gaynelle Acosta.  Current medicines were reviewed with the patient today.  Disposition: FU with me in 6 weeks.   Signed, Satira Sark, MD, [facility] 10/01/2015 4:20 PM    [facility] at  [address], [place], [place] [address] Phone: [phone]; Fax: [phone]

## 2015-11-14 ENCOUNTER — Ambulatory Visit: Payer: Medicare Other | Admitting: Cardiology

## 2016-07-01 DEATH — deceased
